# Patient Record
Sex: Female | Born: 1970
Health system: Southern US, Community
[De-identification: ages and names within clinical notes are randomized; demographics above are authoritative.]

## PROBLEM LIST (undated history)

## (undated) DIAGNOSIS — F329 Major depressive disorder, single episode, unspecified: Secondary | ICD-10-CM

## (undated) DIAGNOSIS — F32A Depression, unspecified: Secondary | ICD-10-CM

## (undated) DIAGNOSIS — J449 Chronic obstructive pulmonary disease, unspecified: Secondary | ICD-10-CM

## (undated) DIAGNOSIS — I10 Essential (primary) hypertension: Secondary | ICD-10-CM

## (undated) DIAGNOSIS — F419 Anxiety disorder, unspecified: Secondary | ICD-10-CM

## (undated) HISTORY — PX: TUBAL LIGATION: SHX77

---

## 2002-10-01 ENCOUNTER — Encounter: Payer: Self-pay | Admitting: Emergency Medicine

## 2002-10-01 ENCOUNTER — Encounter: Payer: Self-pay | Admitting: Internal Medicine

## 2002-10-01 ENCOUNTER — Inpatient Hospital Stay (HOSPITAL_COMMUNITY): Admission: EM | Admit: 2002-10-01 | Discharge: 2002-10-03 | Payer: Self-pay | Admitting: Internal Medicine

## 2002-12-22 ENCOUNTER — Emergency Department (HOSPITAL_COMMUNITY): Admission: EM | Admit: 2002-12-22 | Discharge: 2002-12-22 | Payer: Self-pay | Admitting: *Deleted

## 2004-01-14 ENCOUNTER — Emergency Department (HOSPITAL_COMMUNITY): Admission: EM | Admit: 2004-01-14 | Discharge: 2004-01-14 | Payer: Self-pay | Admitting: Emergency Medicine

## 2004-04-22 ENCOUNTER — Emergency Department (HOSPITAL_COMMUNITY): Admission: EM | Admit: 2004-04-22 | Discharge: 2004-04-22 | Payer: Self-pay | Admitting: Emergency Medicine

## 2004-06-02 ENCOUNTER — Emergency Department (HOSPITAL_COMMUNITY): Admission: EM | Admit: 2004-06-02 | Discharge: 2004-06-02 | Payer: Self-pay | Admitting: Emergency Medicine

## 2004-06-06 ENCOUNTER — Emergency Department (HOSPITAL_COMMUNITY): Admission: EM | Admit: 2004-06-06 | Discharge: 2004-06-06 | Payer: Self-pay | Admitting: Emergency Medicine

## 2004-06-08 ENCOUNTER — Emergency Department (HOSPITAL_COMMUNITY): Admission: EM | Admit: 2004-06-08 | Discharge: 2004-06-09 | Payer: Self-pay | Admitting: *Deleted

## 2004-06-13 ENCOUNTER — Emergency Department (HOSPITAL_COMMUNITY): Admission: EM | Admit: 2004-06-13 | Discharge: 2004-06-13 | Payer: Self-pay | Admitting: Emergency Medicine

## 2004-07-03 ENCOUNTER — Emergency Department (HOSPITAL_COMMUNITY): Admission: EM | Admit: 2004-07-03 | Discharge: 2004-07-03 | Payer: Self-pay | Admitting: Emergency Medicine

## 2004-09-08 ENCOUNTER — Emergency Department (HOSPITAL_COMMUNITY): Admission: EM | Admit: 2004-09-08 | Discharge: 2004-09-08 | Payer: Self-pay | Admitting: Emergency Medicine

## 2005-02-27 ENCOUNTER — Ambulatory Visit (HOSPITAL_COMMUNITY): Admission: RE | Admit: 2005-02-27 | Discharge: 2005-02-27 | Payer: Self-pay | Admitting: Obstetrics and Gynecology

## 2005-02-27 ENCOUNTER — Encounter: Payer: Self-pay | Admitting: Obstetrics and Gynecology

## 2006-06-02 ENCOUNTER — Emergency Department (HOSPITAL_COMMUNITY): Admission: EM | Admit: 2006-06-02 | Discharge: 2006-06-02 | Payer: Self-pay | Admitting: Emergency Medicine

## 2009-02-14 ENCOUNTER — Emergency Department (HOSPITAL_COMMUNITY): Admission: EM | Admit: 2009-02-14 | Discharge: 2009-02-14 | Payer: Self-pay | Admitting: Emergency Medicine

## 2009-06-24 ENCOUNTER — Emergency Department (HOSPITAL_COMMUNITY): Admission: EM | Admit: 2009-06-24 | Discharge: 2009-06-24 | Payer: Self-pay | Admitting: Emergency Medicine

## 2009-10-13 ENCOUNTER — Emergency Department (HOSPITAL_COMMUNITY): Admission: EM | Admit: 2009-10-13 | Discharge: 2009-10-13 | Payer: Self-pay | Admitting: Emergency Medicine

## 2010-11-04 NOTE — Procedures (Signed)
   [name], [name]                           ACCOUNT NO.:  [id number]   MEDICAL RECORD NO.:  [id number]                   PATIENT TYPE:  INP   LOCATION:  IC09                                 FACILITY:  [facility]   PHYSICIAN:  Edward L. Juanetta Gosling, M.D.             DATE OF BIRTH:  11/07/1970   DATE OF PROCEDURE:  10/01/2002  DATE OF DISCHARGE:                                EKG INTERPRETATION   RESULTS:  The rhythm is sinus tachycardia with a rate of about 110.  There  are Q-waves in V1, 2, and 3, which may indicate previous anterior septal  myocardial infarction.  There are ST-T wave changes that are nonspecific and  diffuse.  A normal electrocardiogram.                                               Edward L. Juanetta Gosling, M.D.    ELH/MEDQ  D:  10/01/2002  T:  10/02/2002  Job:  [id number]

## 2010-11-04 NOTE — Procedures (Signed)
   [name], [name]                           ACCOUNT NO.:  [id number]   MEDICAL RECORD NO.:  [id number]                   PATIENT TYPE:  INP   LOCATION:  A308                                 FACILITY:  [facility]   PHYSICIAN:  Edward L. Juanetta Gosling, M.D.             DATE OF BIRTH:  26-Jul-1970   DATE OF PROCEDURE:  10/01/2002  DATE OF DISCHARGE:  10/03/2002                                EKG INTERPRETATION   DATE AND TIME OF TEST:  October 01, 2002 at 0725.   FINDINGS:  The rhythm is sinus rhythm with PVCs.  There is a suggestion of  left atrial enlargement.  Somewhat slow R wave progression across the  precordium may indicate a previous anterior myocardial infarction and  clinical correlation is suggested.   IMPRESSION:  Abnormal electrocardiogram.                                               Edward L. Juanetta Gosling, M.D.    ELH/MEDQ  D:  10/07/2002  T:  10/08/2002  Job:  [id number]
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
## 2010-11-04 NOTE — H&P (Signed)
[name], [name]               ACCOUNT NO.:  [id number]   MEDICAL RECORD NO.:  [id number]          PATIENT TYPE:  AMB   LOCATION:  DAY                           FACILITY:  [facility]   PHYSICIAN:  Tilda Burrow, M.D. DATE OF BIRTH:  Oct 14, 1970   DATE OF ADMISSION:  02/27/2005  DATE OF DISCHARGE:  LH                                HISTORY & PHYSICAL   ADMITTING DIAGNOSES:  Desire for elective permanent sterilization.   HISTORY OF PRESENT ILLNESS:  This 40 year old female referred from  [facility] was referred to our office and is  scheduled for elective permanent sterilization.  She has been seen in our  office, with confirmation of her desire for permanent sterilization.  Laparoscopic tubal sterilization with fallopian rings has been reviewed with  the technical aspects of the procedure discussed in detail.  She had  originally been scheduled for sterilization in May, but due to family  difficulties, had to have the procedure re-scheduled.  Pap smear, GC, and  Chlamydia were all within normal limits earlier this year.   SOCIAL HISTORY:  Significant in that the patient's fiancee is in jail for  DUI and driving without a license.  She is a gravida 4, para 3, with the  youngest child 88 months old.  She has a very difficult family history with  the loss of mother and grandmother and 1 other relative in a brief period of  time.  Nonetheless, she is certain of her desire for permanent sterilization  as she rebuilds her life.   LAST MENSTRUAL PERIOD:  February 17, 2005.  Menses are usually 6 days in  length - 1 day heavy, 1 day light, 3 days heavy, 2 days light.   PHYSICAL EXAMINATION:  HEENT:  Pupils equal, round and reactive to light.  NECK:  Supple.  CHEST:  Clear to auscultation.  ABDOMEN:  Nontender.  PELVIC:  Normal external genitalia with vaginal exam with normal excretions,  on menses.  Cervix multiparous.  Uterus anterior.  Adnexa nontender.   PLAN:   Laparotomy tubal sterilization with fallopian rings on February 27, 2005.      Tilda Burrow, M.D.  Electronically Signed     JVF/MEDQ  D:  02/26/2005  T:  02/27/2005  Job:  [id number]   cc:   [facility]

## 2010-11-04 NOTE — Procedures (Signed)
   NAMEMARICIA, [name]                           ACCOUNT NO.:  [id number]   MEDICAL RECORD NO.:  [id number]                   PATIENT TYPE:  INP   LOCATION:  A308                                 FACILITY:  [facility]   PHYSICIAN:  Edward L. Juanetta Gosling, M.D.             DATE OF BIRTH:  [birth year]   DATE OF PROCEDURE:  10/01/2002  DATE OF DISCHARGE:  10/03/2002                                EKG INTERPRETATION   DATE AND TIME OF TEST:  October 01, 2002 at 0306.   FINDINGS:  The rhythm is sinus rhythm with a rate in the 80s.   IMPRESSION:  Essentially normal electrocardiogram.                                               Edward L. Juanetta Gosling, M.D.    ELH/MEDQ  D:  10/07/2002  T:  10/08/2002  Job:  [id number]

## 2010-11-04 NOTE — Op Note (Signed)
[name], [name]               ACCOUNT NO.:  [id number]   MEDICAL RECORD NO.:  [id number]          PATIENT TYPE:  AMB   LOCATION:  DAY                           FACILITY:  [facility]   PHYSICIAN:  Tilda Burrow, M.D. DATE OF BIRTH:  [birth year]   DATE OF PROCEDURE:  DATE OF DISCHARGE:                                 OPERATIVE REPORT   PREOPERATIVE DIAGNOSES:  1.  Elective sterilization.  2.  Cervical polyp.   POSTOPERATIVE DIAGNOSES:  1.  Elective sterilization.  2.  Cervical polyp.   PROCEDURE:  Laparoscopic tubal sterilization, Falope ring, excision of  cervical polyp.   SURGEON:  Tilda Burrow, M.D.   ASSISTANTAmie Critchley, CST   ANESTHESIA:  General.   COMPLICATIONS:  None.   FINDINGS:  Cervical polyp high in the endocervical canal, taken out in  fragments, sent to lab for histology.  Omental adhesions to the anterior  abdominal wall partially transected.  Some residual adhesions near the  umbilicus left intact.   DETAILS OF PROCEDURE:  The patient was taken to the operating room and  prepped and draped for combined abdominal and vaginal procedure.  We then  used the speculum and inserted it into the vagina, identified the cervix,  grasped the cervix, pulled it down, and could see this endocervical polyp.  This was grasped with Randall stone forceps and Metzenbaum scissors were  used to snip it.  The polyp came out in fragments.  A portion of the  specimen was sent for histology.   In-and-out catheterization was performed and then Hulka tenaculum attached  to the cervix for uterine manipulation.  The infraumbilical vertical 1-cm  skin incision was performed as well as the transverse suprapubic incision.  Veress needle was introduced through the umbilicus with ease.  The  peritoneal cavity was easily accessed and under 12 mmHg, the  pneumoperitoneum was achieved.  Laparoscopic trocar was introduced through  the umbilicus easily with no evidence of trauma to  internal organs.  There  were some omental adhesions to the anterior abdominal wall just to the left  and below the umbilicus.  Suprapubic trocar was inserted under direct  laparoscopic visualization and then attention directed to the adnexa.  The  right adnexa could be identified first and the Falope ring applier used to  grasp the mid portion of the tube, elevate it, and apply the ring without  difficulty.  We then proceeded with placement of the left Falope ring with  similar ease.  There was difficulty due to the patient's obesity and  abdominal wall laxity and we could not anesthetize the left tube.  The right  tube was able to be anesthetized with percutaneous Marcaine solution, both  in the broad ligament and in the incarcerated knuckle of tube.  The  Metzenbaum scissors were used to trim portions of the omental adhesion  before placement of the left fallopian tube ring.  This allowed improved  visibility.  Only the portion of adhesions that interfered with visibility  were addressed.  The rest were left intact without difficulty.   Deflation of the  abdomen was followed by 200 cc of saline instilled in the  abdomen.  Laparoscopic trocars were removed.  Subcuticular 4-0 Dexon closure  of both skin incisions and the patient went to the recovery room in good  condition.      Tilda Burrow, M.D.  Electronically Signed     JVF/MEDQ  D:  02/27/2005  T:  02/27/2005  Job:  [id number]
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
## 2011-07-14 ENCOUNTER — Encounter (HOSPITAL_COMMUNITY): Payer: Self-pay | Admitting: *Deleted

## 2011-07-14 ENCOUNTER — Emergency Department (HOSPITAL_COMMUNITY)
Admission: EM | Admit: 2011-07-14 | Discharge: 2011-07-14 | Disposition: A | Discharge status: Home / Self Care | Payer: Self-pay | Attending: Emergency Medicine | Admitting: Emergency Medicine

## 2011-07-14 DIAGNOSIS — X12XXXA Contact with other hot fluids, initial encounter: Secondary | ICD-10-CM | POA: Insufficient documentation

## 2011-07-14 DIAGNOSIS — T2122XA Burn of second degree of abdominal wall, initial encounter: Secondary | ICD-10-CM | POA: Insufficient documentation

## 2011-07-14 DIAGNOSIS — R109 Unspecified abdominal pain: Secondary | ICD-10-CM | POA: Insufficient documentation

## 2011-07-14 DIAGNOSIS — F17200 Nicotine dependence, unspecified, uncomplicated: Secondary | ICD-10-CM | POA: Insufficient documentation

## 2011-07-14 LAB — DIFFERENTIAL
Basophils Absolute: 0 10*3/uL (ref 0.0–0.1)
Eosinophils Absolute: 0.3 10*3/uL (ref 0.0–0.7)
Eosinophils Relative: 3 % (ref 0–5)
Lymphocytes Relative: 24 % (ref 12–46)
Lymphs Abs: 2.3 10*3/uL (ref 0.7–4.0)
Monocytes Absolute: 0.7 10*3/uL (ref 0.1–1.0)
Monocytes Relative: 7 % (ref 3–12)
Neutrophils Relative %: 67 % (ref 43–77)

## 2011-07-14 LAB — CBC
HCT: 35.9 % — ABNORMAL LOW (ref 36.0–46.0)
MCH: 30.5 pg (ref 26.0–34.0)
MCHC: 32.6 g/dL (ref 30.0–36.0)
MCV: 93.7 fL (ref 78.0–100.0)
Platelets: 255 10*3/uL (ref 150–400)
RDW: 13.5 % (ref 11.5–15.5)

## 2011-07-14 MED ORDER — SILVER SULFADIAZINE 1 % EX CREA
TOPICAL_CREAM | Freq: Once | CUTANEOUS | Status: AC
  Administered 2011-07-14: 22:00:00 via TOPICAL
  Filled 2011-07-14: qty 85

## 2011-07-14 MED ORDER — OXYCODONE-ACETAMINOPHEN 5-325 MG PO TABS
2.0000 | ORAL_TABLET | Freq: Once | ORAL | Status: AC
  Administered 2011-07-14: 2 via ORAL
  Filled 2011-07-14 (×2): qty 2

## 2011-07-14 MED ORDER — OXYCODONE-ACETAMINOPHEN 5-325 MG PO TABS: 1.0000 | ORAL_TABLET | Freq: Four times a day (QID) | ORAL | Status: AC | PRN
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
## 2011-07-17 LAB — POCT I-STAT, CHEM 8
BUN: 15 mg/dL (ref 6–23)
Calcium, Ion: 1.23 mmol/L (ref 1.12–1.32)
Chloride: 109 mEq/L (ref 96–112)
Creatinine, Ser: 0.6 mg/dL (ref 0.50–1.10)
Glucose, Bld: 136 mg/dL — ABNORMAL HIGH (ref 70–99)
HCT: 66 % — ABNORMAL HIGH (ref 36.0–46.0)
Hemoglobin: 22.4 g/dL (ref 12.0–15.0)
Potassium: 3.4 mEq/L — ABNORMAL LOW (ref 3.5–5.1)
Sodium: 141 mEq/L (ref 135–145)
TCO2: 25 mmol/L (ref 0–100)

## 2012-02-12 ENCOUNTER — Emergency Department (HOSPITAL_COMMUNITY)
Admission: EM | Admit: 2012-02-12 | Discharge: 2012-02-12 | Disposition: A | Discharge status: Home / Self Care | Payer: Self-pay | Attending: Emergency Medicine | Admitting: Emergency Medicine

## 2012-02-12 ENCOUNTER — Encounter (HOSPITAL_COMMUNITY): Payer: Self-pay | Admitting: *Deleted

## 2012-02-12 DIAGNOSIS — F17200 Nicotine dependence, unspecified, uncomplicated: Secondary | ICD-10-CM | POA: Insufficient documentation

## 2012-02-12 DIAGNOSIS — L02419 Cutaneous abscess of limb, unspecified: Secondary | ICD-10-CM

## 2012-02-12 DIAGNOSIS — IMO0002 Reserved for concepts with insufficient information to code with codable children: Secondary | ICD-10-CM | POA: Insufficient documentation

## 2012-02-12 MED ORDER — DOXYCYCLINE HYCLATE 100 MG PO TABS
100.0000 mg | ORAL_TABLET | Freq: Once | ORAL | Status: AC
  Administered 2012-02-12: 100 mg via ORAL
  Filled 2012-02-12: qty 1

## 2012-02-12 MED ORDER — HYDROCODONE-ACETAMINOPHEN 5-325 MG PO TABS
1.0000 | ORAL_TABLET | Freq: Once | ORAL | Status: AC
  Administered 2012-02-12: 1 via ORAL
  Filled 2012-02-12: qty 1

## 2012-02-12 MED ORDER — HYDROCODONE-ACETAMINOPHEN 5-325 MG PO TABS: 1.0000 | ORAL_TABLET | Freq: Four times a day (QID) | ORAL | Status: AC | PRN

## 2012-02-12 MED ORDER — DOXYCYCLINE HYCLATE 100 MG PO CAPS: 100.0000 mg | ORAL_CAPSULE | Freq: Two times a day (BID) | ORAL | Status: AC

## 2012-02-12 NOTE — ED Notes (Signed)
Alert, NAD, abscess rt axilla, Has had similar episodes in past.

## 2012-02-12 NOTE — ED Notes (Signed)
Abscess to right axillary x 1.5 weeks per pt, denies any N/V/D, fevers at night low grade per pt.

## 2012-02-12 NOTE — ED Provider Notes (Signed)
History     CSN: [id number]  Arrival date & time 02/12/12  1458   First MD Initiated Contact with Patient 02/12/12 1522      Chief Complaint  Patient presents with  . Abscess    (Consider location/radiation/quality/duration/timing/severity/associated sxs/prior treatment) HPI Comments: Has had  Axillary abscesses in past told by dr. Jorene Guest, her PCP to stop shaving.  Patient is a 41 y.o. female presenting with abscess. The history is provided by the patient. No language interpreter was used.  Abscess  This is a new problem. Episode onset: 1.5 weeks ago. The problem has been gradually worsening. Affected Location: R axilla. The problem is moderate. The abscess is characterized by redness and painfulness. The abscess first occurred at home. Pertinent negatives include no fever. Her past medical history does not include atopy in family or skin abscesses in family. There were no sick contacts. She has received no recent medical care.    No past medical history on file.  Past Surgical History  Procedure Date  . Cesarean section     No family history on file.  History  Substance Use Topics  . Smoking status: Current Everyday Smoker -- 0.5 packs/day    Types: Cigarettes  . Smokeless tobacco: Not on file  . Alcohol Use: No    OB History    Grav Para Term Preterm Abortions TAB SAB Ect Mult Living                  Review of Systems  Constitutional: Negative for fever.  Skin:       Abscess   All other systems reviewed and are negative.    Allergies  Coconut oil; Latex; Peach; and Tramadol  Home Medications   Current Outpatient Rx  Name Route Sig Dispense Refill  . ACETAMINOPHEN 500 MG PO TABS Oral Take 1,000 mg by mouth 2 (two) times daily as needed. For fever and/or pain      BP 148/97  Pulse 89  Temp 98.3 F (36.8 C) (Oral)  Resp 20  Ht 5\' 3"  (1.6 m)  Wt 227 lb (102.967 kg)  BMI 40.21 kg/m2  SpO2 99%  LMP 01/29/2012  Physical Exam  Nursing note and  vitals reviewed. Constitutional: She is oriented to person, place, and time. She appears well-developed and well-nourished. No distress.  HENT:  Head: Normocephalic and atraumatic.  Eyes: EOM are normal.  Neck: Normal range of motion.  Cardiovascular: Normal rate, regular rhythm and normal heart sounds.   Pulmonary/Chest: Effort normal and breath sounds normal.  Abdominal: Soft. She exhibits no distension. There is no tenderness.  Musculoskeletal: Normal range of motion.  Neurological: She is alert and oriented to person, place, and time.  Skin: Skin is warm and dry.       Area of redness and induration ~ 1 x 2 cm in R axilla.  + PT.    No fluctuance.   Psychiatric: She has a normal mood and affect. Judgment normal.    ED Course  Procedures (including critical care time)  Labs Reviewed - No data to display No results found.   1. Axillary abscess       MDM  rx-doxycycline , 20 Hydrocodone, 20 Warm compresses. Return prn         Evalina Field, [place] 02/12/12 1656

## 2012-02-13 NOTE — ED Provider Notes (Signed)
Medical screening examination/treatment/procedure(s) were performed by non-physician practitioner and as supervising physician I was immediately available for consultation/collaboration.   Carleene Cooper III, MD 02/13/12 1154

## 2012-03-22 ENCOUNTER — Encounter (HOSPITAL_COMMUNITY): Payer: Self-pay | Admitting: Emergency Medicine

## 2012-03-22 ENCOUNTER — Emergency Department (HOSPITAL_COMMUNITY)
Admission: EM | Admit: 2012-03-22 | Discharge: 2012-03-22 | Disposition: A | Discharge status: Home / Self Care | Payer: Self-pay | Attending: Emergency Medicine | Admitting: Emergency Medicine

## 2012-03-22 DIAGNOSIS — F3289 Other specified depressive episodes: Secondary | ICD-10-CM | POA: Insufficient documentation

## 2012-03-22 DIAGNOSIS — S0180XA Unspecified open wound of other part of head, initial encounter: Secondary | ICD-10-CM | POA: Insufficient documentation

## 2012-03-22 DIAGNOSIS — Z79899 Other long term (current) drug therapy: Secondary | ICD-10-CM | POA: Insufficient documentation

## 2012-03-22 DIAGNOSIS — F411 Generalized anxiety disorder: Secondary | ICD-10-CM | POA: Insufficient documentation

## 2012-03-22 DIAGNOSIS — Y9210 Unspecified residential institution as the place of occurrence of the external cause: Secondary | ICD-10-CM | POA: Insufficient documentation

## 2012-03-22 DIAGNOSIS — S01459A Open bite of unspecified cheek and temporomandibular area, initial encounter: Secondary | ICD-10-CM

## 2012-03-22 DIAGNOSIS — W540XXA Bitten by dog, initial encounter: Secondary | ICD-10-CM | POA: Insufficient documentation

## 2012-03-22 DIAGNOSIS — F17200 Nicotine dependence, unspecified, uncomplicated: Secondary | ICD-10-CM | POA: Insufficient documentation

## 2012-03-22 DIAGNOSIS — F329 Major depressive disorder, single episode, unspecified: Secondary | ICD-10-CM | POA: Insufficient documentation

## 2012-03-22 HISTORY — DX: Anxiety disorder, unspecified: F41.9

## 2012-03-22 HISTORY — DX: Major depressive disorder, single episode, unspecified: F32.9

## 2012-03-22 HISTORY — DX: Depression, unspecified: F32.A

## 2012-03-22 MED ORDER — AMOXICILLIN-POT CLAVULANATE 875-125 MG PO TABS: 1.0000 | ORAL_TABLET | Freq: Two times a day (BID) | ORAL | Status: DC

## 2012-03-22 MED ORDER — HYDROCODONE-ACETAMINOPHEN 5-325 MG PO TABS
2.0000 | ORAL_TABLET | Freq: Once | ORAL | Status: AC
  Administered 2012-03-22: 2 via ORAL
  Filled 2012-03-22: qty 2

## 2012-03-22 MED ORDER — ONDANSETRON HCL 4 MG PO TABS
4.0000 mg | ORAL_TABLET | Freq: Once | ORAL | Status: AC
  Administered 2012-03-22: 4 mg via ORAL
  Filled 2012-03-22: qty 1

## 2012-03-22 MED ORDER — HYDROCODONE-ACETAMINOPHEN 5-325 MG PO TABS: 1.0000 | ORAL_TABLET | ORAL | Status: DC | PRN

## 2012-03-22 MED ORDER — AMOXICILLIN-POT CLAVULANATE 875-125 MG PO TABS
1.0000 | ORAL_TABLET | Freq: Once | ORAL | Status: AC
  Administered 2012-03-22: 1 via ORAL
  Filled 2012-03-22: qty 1

## 2012-03-22 MED ORDER — TETANUS-DIPHTH-ACELL PERTUSSIS 5-2.5-18.5 LF-MCG/0.5 IM SUSP
0.5000 mL | Freq: Once | INTRAMUSCULAR | Status: DC
  Filled 2012-03-22: qty 0.5
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
## 2012-03-22 NOTE — ED Notes (Signed)
H. Bryant, PA at bedside. 
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
## 2012-03-28 NOTE — ED Provider Notes (Signed)
Medical screening examination/treatment/procedure(s) were performed by non-physician practitioner and as supervising physician I was immediately available for consultation/collaboration.   For 03/22/12 Note by Mr. Tawanna Solo, MD 04/02/12 [phone]

## 2012-04-02 NOTE — ED Provider Notes (Signed)
Medical screening examination/treatment/procedure(s) were performed by non-physician practitioner and as supervising physician I was immediately available for consultation/collaboration.  Flint Melter, MD 04/02/12 0100

## 2012-05-04 ENCOUNTER — Emergency Department (HOSPITAL_COMMUNITY)
Admission: EM | Admit: 2012-05-04 | Discharge: 2012-05-04 | Disposition: A | Discharge status: Home / Self Care | Payer: Self-pay | Attending: Emergency Medicine | Admitting: Emergency Medicine

## 2012-05-04 ENCOUNTER — Encounter (HOSPITAL_COMMUNITY): Payer: Self-pay

## 2012-05-04 DIAGNOSIS — Z8659 Personal history of other mental and behavioral disorders: Secondary | ICD-10-CM | POA: Insufficient documentation

## 2012-05-04 DIAGNOSIS — K0889 Other specified disorders of teeth and supporting structures: Secondary | ICD-10-CM

## 2012-05-04 DIAGNOSIS — F17200 Nicotine dependence, unspecified, uncomplicated: Secondary | ICD-10-CM | POA: Insufficient documentation

## 2012-05-04 DIAGNOSIS — K089 Disorder of teeth and supporting structures, unspecified: Secondary | ICD-10-CM | POA: Insufficient documentation

## 2012-05-04 MED ORDER — HYDROCODONE-ACETAMINOPHEN 5-325 MG PO TABS: ORAL_TABLET | ORAL | Status: DC

## 2012-05-04 MED ORDER — PENICILLIN V POTASSIUM 500 MG PO TABS: 500.0000 mg | ORAL_TABLET | Freq: Four times a day (QID) | ORAL | Status: AC

## 2012-05-04 MED ORDER — HYDROCODONE-ACETAMINOPHEN 5-325 MG PO TABS
1.0000 | ORAL_TABLET | Freq: Once | ORAL | Status: AC
  Administered 2012-05-04: 1 via ORAL
  Filled 2012-05-04: qty 1

## 2012-05-04 MED ORDER — PENICILLIN V POTASSIUM 250 MG PO TABS
500.0000 mg | ORAL_TABLET | Freq: Once | ORAL | Status: AC
  Administered 2012-05-04: 500 mg via ORAL
  Filled 2012-05-04: qty 2

## 2012-05-04 NOTE — ED Provider Notes (Signed)
History     CSN: [id number]  Arrival date & time 05/04/12  1339   First MD Initiated Contact with Patient 05/04/12 1358      Chief Complaint  Patient presents with  . Dental Pain    (Consider location/radiation/quality/duration/timing/severity/associated sxs/prior treatment) Patient is a 41 y.o. female presenting with tooth pain. The history is provided by the patient.  Dental PainThe primary symptoms include mouth pain. Primary symptoms do not include dental injury, oral bleeding, oral lesions, headaches, fever, shortness of breath, sore throat, angioedema or cough. The symptoms began 3 to 5 days ago. The symptoms are worsening. The symptoms are new. The symptoms occur constantly.  Affected locations include: teeth.  Additional symptoms include: dental sensitivity to temperature and gum tenderness. Additional symptoms do not include: gum swelling, purulent gums, trismus, jaw pain, facial swelling, trouble swallowing, pain with swallowing, drooling, ear pain and swollen glands. Medical issues include: smoking and periodontal disease.    Past Medical History  Diagnosis Date  . Anxiety   . Depression     Past Surgical History  Procedure Date  . Cesarean section     No family history on file.  History  Substance Use Topics  . Smoking status: Current Every Day Smoker -- 0.5 packs/day    Types: Cigarettes  . Smokeless tobacco: Not on file  . Alcohol Use: No    OB History    Grav Para Term Preterm Abortions TAB SAB Ect Mult Living                  Review of Systems  Constitutional: Negative for fever and appetite change.  HENT: Positive for dental problem. Negative for ear pain, congestion, sore throat, facial swelling, drooling, trouble swallowing, neck pain and neck stiffness.   Eyes: Negative for pain and visual disturbance.  Respiratory: Negative for cough and shortness of breath.   Neurological: Negative for dizziness, facial asymmetry and headaches.    Hematological: Negative for adenopathy.  All other systems reviewed and are negative.    Allergies  Coconut oil; Latex; Peach; and Tramadol  Home Medications   Current Outpatient Rx  Name  Route  Sig  Dispense  Refill  . ACETAMINOPHEN 500 MG PO TABS   Oral   Take 500 mg by mouth every 6 (six) hours as needed. Pain           BP 148/78  Pulse 88  Temp 98.5 F (36.9 C) (Oral)  Resp 20  Ht 5' 3.5" (1.613 m)  Wt 240 lb (108.863 kg)  BMI 41.85 kg/m2  SpO2 100%  LMP 04/19/2012  Physical Exam  Nursing note and vitals reviewed. Constitutional: She is oriented to person, place, and time. She appears well-developed and well-nourished. No distress.  HENT:  Head: Normocephalic and atraumatic. No trismus in the jaw.  Right Ear: Tympanic membrane and ear canal normal.  Left Ear: Tympanic membrane and ear canal normal.  Mouth/Throat: Uvula is midline, oropharynx is clear and moist and mucous membranes are normal. Dental caries present. No dental abscesses or uvula swelling.         ttp of the right second molar.  No facial edema, trismus or obvious dental abscess  Eyes: EOM are normal. Pupils are equal, round, and reactive to light.  Neck: Normal range of motion. Neck supple.  Cardiovascular: Normal rate, regular rhythm, normal heart sounds and intact distal pulses.   No murmur heard. Pulmonary/Chest: Effort normal and breath sounds normal.  Musculoskeletal: Normal range of motion.  Lymphadenopathy:    She has no cervical adenopathy.  Neurological: She is alert and oriented to person, place, and time. She exhibits normal muscle tone. Coordination normal.  Skin: Skin is warm and dry.    ED Course  Procedures (including critical care time)  Labs Reviewed - No data to display No results found.      MDM    Dental decay of the right lower second molar.  Pt advised that she willneed further management of dental problem with a dentist.  Referral list  given  Prescribed: Pen VK norco #20        Glendal Cassaday L. Iantha, [place] 05/05/12 2111

## 2012-05-04 NOTE — ED Notes (Signed)
Pt reports toothache to right lower

## 2012-05-06 NOTE — ED Provider Notes (Signed)
Medical screening examination/treatment/procedure(s) were performed by non-physician practitioner and as supervising physician I was immediately available for consultation/collaboration.   Shelda Jakes, MD 05/06/12 0030

## 2012-06-16 ENCOUNTER — Emergency Department (HOSPITAL_COMMUNITY)
Admission: EM | Admit: 2012-06-16 | Discharge: 2012-06-16 | Disposition: A | Discharge status: Home / Self Care | Payer: Self-pay | Attending: Emergency Medicine | Admitting: Emergency Medicine

## 2012-06-16 ENCOUNTER — Emergency Department (HOSPITAL_COMMUNITY): Payer: Self-pay

## 2012-06-16 ENCOUNTER — Encounter (HOSPITAL_COMMUNITY): Payer: Self-pay

## 2012-06-16 DIAGNOSIS — M25552 Pain in left hip: Secondary | ICD-10-CM

## 2012-06-16 DIAGNOSIS — F17200 Nicotine dependence, unspecified, uncomplicated: Secondary | ICD-10-CM | POA: Insufficient documentation

## 2012-06-16 DIAGNOSIS — Z8659 Personal history of other mental and behavioral disorders: Secondary | ICD-10-CM | POA: Insufficient documentation

## 2012-06-16 DIAGNOSIS — F411 Generalized anxiety disorder: Secondary | ICD-10-CM | POA: Insufficient documentation

## 2012-06-16 DIAGNOSIS — Z8739 Personal history of other diseases of the musculoskeletal system and connective tissue: Secondary | ICD-10-CM | POA: Insufficient documentation

## 2012-06-16 DIAGNOSIS — K089 Disorder of teeth and supporting structures, unspecified: Secondary | ICD-10-CM | POA: Insufficient documentation

## 2012-06-16 DIAGNOSIS — M25559 Pain in unspecified hip: Secondary | ICD-10-CM | POA: Insufficient documentation

## 2012-06-16 MED ORDER — DEXAMETHASONE 4 MG PO TABS: ORAL_TABLET | ORAL | Status: DC

## 2012-06-16 MED ORDER — HYDROCODONE-ACETAMINOPHEN 5-325 MG PO TABS: ORAL_TABLET | ORAL | Status: DC

## 2012-06-16 MED ORDER — DEXAMETHASONE SODIUM PHOSPHATE 4 MG/ML IJ SOLN
8.0000 mg | Freq: Once | INTRAMUSCULAR | Status: AC
  Administered 2012-06-16: 8 mg via INTRAMUSCULAR
  Filled 2012-06-16: qty 2

## 2012-06-16 MED ORDER — PROMETHAZINE HCL 12.5 MG PO TABS
12.5000 mg | ORAL_TABLET | Freq: Once | ORAL | Status: AC
  Administered 2012-06-16: 12.5 mg via ORAL
  Filled 2012-06-16: qty 1

## 2012-06-16 MED ORDER — MORPHINE SULFATE 4 MG/ML IJ SOLN
8.0000 mg | Freq: Once | INTRAMUSCULAR | Status: AC
  Administered 2012-06-16: 8 mg via INTRAMUSCULAR
  Filled 2012-06-16: qty 2

## 2012-06-16 NOTE — ED Notes (Signed)
Pt presents with left hip pain that awoke her this morning. Pt denies injury, However does state she has a new job and has been working 12 hr shifts standing in one spot. Pain has increased  Throughout the day.  NAD noted at this time.  Pt refusing crutches at this time secondary to has a walker at home.

## 2012-06-16 NOTE — ED Notes (Signed)
Pt reports left hip pain, pain woke her from sleep.  Denies any known injury.

## 2012-06-16 NOTE — ED Provider Notes (Signed)
History     CSN: [id number]  Arrival date & time 06/16/12  1547   First MD Initiated Contact with Patient 06/16/12 1707      Chief Complaint  Patient presents with  . Hip Pain    (Consider location/radiation/quality/duration/timing/severity/associated sxs/prior treatment) HPI Comments: Pt has hx of left knee problems. She has been working extra during [employer] doing a lot of standing. Today she was awaken with severe pain of the left hip. Denies fever, chills, or previous operations.  Patient is a 41 y.o. female presenting with hip pain. The history is provided by the patient.  Hip Pain This is a chronic problem. The current episode started today. The problem occurs constantly. The problem has been gradually worsening. Associated symptoms include arthralgias. Pertinent negatives include no abdominal pain, chest pain, coughing, fever or neck pain. The symptoms are aggravated by bending, standing and walking. She has tried acetaminophen for the symptoms. The treatment provided no relief.    Past Medical History  Diagnosis Date  . Anxiety   . Depression     Past Surgical History  Procedure Date  . Cesarean section     No family history on file.  History  Substance Use Topics  . Smoking status: Current Every Day Smoker -- 0.5 packs/day    Types: Cigarettes  . Smokeless tobacco: Not on file  . Alcohol Use: No    OB History    Grav Para Term Preterm Abortions TAB SAB Ect Mult Living                  Review of Systems  Constitutional: Negative for fever and activity change.       All ROS Neg except as noted in HPI  HENT: Positive for dental problem. Negative for nosebleeds and neck pain.   Eyes: Negative for photophobia and discharge.  Respiratory: Negative for cough, shortness of breath and wheezing.   Cardiovascular: Negative for chest pain and palpitations.  Gastrointestinal: Negative for abdominal pain and blood in stool.  Genitourinary: Negative  for dysuria, frequency and hematuria.  Musculoskeletal: Positive for arthralgias. Negative for back pain.  Skin: Negative.   Neurological: Negative for dizziness, seizures and speech difficulty.  Psychiatric/Behavioral: Negative for hallucinations and confusion. The patient is nervous/anxious.     Allergies  Coconut oil; Latex; Peach; and Tramadol  Home Medications   Current Outpatient Rx  Name  Route  Sig  Dispense  Refill  . ACETAMINOPHEN 500 MG PO TABS   Oral   Take 500 mg by mouth every 6 (six) hours as needed. Pain         . HYDROCODONE-ACETAMINOPHEN 5-325 MG PO TABS      Take one-two tabs po q 4-6 hrs prn pain   20 tablet   0     BP 154/84  Pulse 94  Temp 98.1 F (36.7 C) (Oral)  Resp 20  Ht 5' 3.5" (1.613 m)  Wt 226 lb (102.513 kg)  BMI 39.41 kg/m2  SpO2 100%  LMP 06/13/2012  Physical Exam  Nursing note and vitals reviewed. Constitutional: She is oriented to person, place, and time. She appears well-developed and well-nourished.  Non-toxic appearance.  HENT:  Head: Normocephalic.  Right Ear: Tympanic membrane and external ear normal.  Left Ear: Tympanic membrane and external ear normal.  Eyes: EOM and lids are normal. Pupils are equal, round, and reactive to light.  Neck: Normal range of motion. Neck supple. Carotid bruit is not present.  Cardiovascular: Normal  rate, regular rhythm, normal heart sounds, intact distal pulses and normal pulses.   Pulmonary/Chest: Breath sounds normal. No respiratory distress.       Few scattered rhonchi.  Abdominal: Soft. Bowel sounds are normal. There is no tenderness. There is no guarding.  Musculoskeletal: Normal range of motion.       Pain with attempted ROM and palpation of the left hip.  No hot areas. No deformity. Distal pulses wnl. Neg Homan's sign. Soreness with attempted ROM of the left knee.  Lymphadenopathy:       Head (right side): No submandibular adenopathy present.       Head (left side): No submandibular  adenopathy present.    She has no cervical adenopathy.  Neurological: She is alert and oriented to person, place, and time. She has normal strength. No cranial nerve deficit or sensory deficit.  Skin: Skin is warm and dry.  Psychiatric: She has a normal mood and affect. Her speech is normal.    ED Course  Procedures (including critical care time)  Labs Reviewed - No data to display Dg Hip Complete Left  06/16/2012  *RADIOLOGY REPORT*  Clinical Data: Left hip pain.  LEFT HIP - COMPLETE 2+ VIEW  Comparison: None.  Findings: Hip joint spaces maintained.  No degenerative changes. No fracture.  IMPRESSION: Negative.   Original Report Authenticated By: Leanna Battles, M.D.      No diagnosis found.    MDM  I have reviewed nursing notes, vital signs, and all appropriate lab and imaging results for this patient. The xray is negative for fracture or dislocation. Vital signs are wnl. No reported hx of trauma. No increase redness, medial thigh pain, or edema. No previous hx of DVT. Suspect pt is having an inflammatory attack following increase standing and walking. Pt treated with dexamethasone, and norco. Pt to see Dr Romeo Apple for additional evaluation.       Kathie Dike, [place] 06/16/12 1737

## 2012-06-17 NOTE — ED Provider Notes (Signed)
Medical screening examination/treatment/procedure(s) were performed by non-physician practitioner and as supervising physician I was immediately available for consultation/collaboration.  Kiley Torrence, MD 06/17/12 2055 

## 2012-06-30 ENCOUNTER — Encounter (HOSPITAL_COMMUNITY): Payer: Self-pay | Admitting: *Deleted

## 2012-06-30 ENCOUNTER — Emergency Department (HOSPITAL_COMMUNITY)
Admission: EM | Admit: 2012-06-30 | Discharge: 2012-06-30 | Disposition: A | Discharge status: Home / Self Care | Payer: No Typology Code available for payment source | Attending: Emergency Medicine | Admitting: Emergency Medicine

## 2012-06-30 ENCOUNTER — Emergency Department (HOSPITAL_COMMUNITY): Payer: No Typology Code available for payment source

## 2012-06-30 DIAGNOSIS — Y92410 Unspecified street and highway as the place of occurrence of the external cause: Secondary | ICD-10-CM | POA: Insufficient documentation

## 2012-06-30 DIAGNOSIS — S335XXA Sprain of ligaments of lumbar spine, initial encounter: Secondary | ICD-10-CM | POA: Insufficient documentation

## 2012-06-30 DIAGNOSIS — Y9389 Activity, other specified: Secondary | ICD-10-CM | POA: Insufficient documentation

## 2012-06-30 DIAGNOSIS — Z8659 Personal history of other mental and behavioral disorders: Secondary | ICD-10-CM | POA: Insufficient documentation

## 2012-06-30 DIAGNOSIS — S39012A Strain of muscle, fascia and tendon of lower back, initial encounter: Secondary | ICD-10-CM

## 2012-06-30 DIAGNOSIS — F17200 Nicotine dependence, unspecified, uncomplicated: Secondary | ICD-10-CM | POA: Insufficient documentation

## 2012-06-30 MED ORDER — OXYCODONE-ACETAMINOPHEN 5-325 MG PO TABS
1.0000 | ORAL_TABLET | Freq: Once | ORAL | Status: AC
  Administered 2012-06-30: 1 via ORAL
  Filled 2012-06-30: qty 1

## 2012-06-30 MED ORDER — IBUPROFEN 600 MG PO TABS: 600.0000 mg | ORAL_TABLET | Freq: Four times a day (QID) | ORAL | Status: AC | PRN

## 2012-06-30 MED ORDER — OXYCODONE-ACETAMINOPHEN 5-325 MG PO TABS: 1.0000 | ORAL_TABLET | ORAL | Status: AC | PRN
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
## 2012-07-01 NOTE — ED Provider Notes (Signed)
Medical screening examination/treatment/procedure(s) were performed by non-physician practitioner and as supervising physician I was immediately available for consultation/collaboration.  Markeshia Giebel, MD 07/01/12 2208 

## 2012-07-21 ENCOUNTER — Emergency Department (HOSPITAL_COMMUNITY)
Admission: EM | Admit: 2012-07-21 | Discharge: 2012-07-21 | Disposition: A | Discharge status: Home / Self Care | Payer: No Typology Code available for payment source | Attending: Emergency Medicine | Admitting: Emergency Medicine

## 2012-07-21 ENCOUNTER — Encounter (HOSPITAL_COMMUNITY): Payer: Self-pay | Admitting: Emergency Medicine

## 2012-07-21 DIAGNOSIS — F17200 Nicotine dependence, unspecified, uncomplicated: Secondary | ICD-10-CM | POA: Insufficient documentation

## 2012-07-21 DIAGNOSIS — Z8659 Personal history of other mental and behavioral disorders: Secondary | ICD-10-CM | POA: Insufficient documentation

## 2012-07-21 DIAGNOSIS — F411 Generalized anxiety disorder: Secondary | ICD-10-CM | POA: Insufficient documentation

## 2012-07-21 DIAGNOSIS — M5430 Sciatica, unspecified side: Secondary | ICD-10-CM

## 2012-07-21 DIAGNOSIS — Z8739 Personal history of other diseases of the musculoskeletal system and connective tissue: Secondary | ICD-10-CM | POA: Insufficient documentation

## 2012-07-21 DIAGNOSIS — Z87828 Personal history of other (healed) physical injury and trauma: Secondary | ICD-10-CM | POA: Insufficient documentation

## 2012-07-21 MED ORDER — HYDROCODONE-ACETAMINOPHEN 7.5-325 MG PO TABS: 1.0000 | ORAL_TABLET | ORAL | Status: AC | PRN

## 2012-07-21 MED ORDER — DEXAMETHASONE 4 MG PO TABS: ORAL_TABLET | ORAL | Status: DC

## 2012-07-21 MED ORDER — BACLOFEN 10 MG PO TABS: 10.0000 mg | ORAL_TABLET | Freq: Three times a day (TID) | ORAL | Status: AC

## 2012-07-21 MED ORDER — DEXAMETHASONE SODIUM PHOSPHATE 4 MG/ML IJ SOLN
8.0000 mg | Freq: Once | INTRAMUSCULAR | Status: AC
  Administered 2012-07-21: 8 mg via INTRAMUSCULAR
  Filled 2012-07-21: qty 2

## 2012-07-21 MED ORDER — MORPHINE SULFATE 4 MG/ML IJ SOLN
8.0000 mg | Freq: Once | INTRAMUSCULAR | Status: AC
  Administered 2012-07-21: 8 mg via INTRAMUSCULAR
  Filled 2012-07-21: qty 2

## 2012-07-21 MED ORDER — DIAZEPAM 5 MG PO TABS
5.0000 mg | ORAL_TABLET | Freq: Once | ORAL | Status: AC
  Administered 2012-07-21: 5 mg via ORAL
  Filled 2012-07-21: qty 1

## 2012-07-21 MED ORDER — ONDANSETRON HCL 4 MG PO TABS
4.0000 mg | ORAL_TABLET | Freq: Once | ORAL | Status: AC
  Administered 2012-07-21: 4 mg via ORAL
  Filled 2012-07-21: qty 1
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
## 2012-07-21 NOTE — ED Notes (Signed)
H. Bryant, PA at bedside. 
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
## 2012-07-22 NOTE — ED Provider Notes (Signed)
Medical screening examination/treatment/procedure(s) were performed by non-physician practitioner and as supervising physician I was immediately available for consultation/collaboration. Devoria Albe, MD, FACEP   Ward Givens, MD 07/22/12 [phone]

## 2012-10-20 ENCOUNTER — Emergency Department (HOSPITAL_COMMUNITY)
Admission: EM | Admit: 2012-10-20 | Discharge: 2012-10-21 | Disposition: A | Discharge status: Home / Self Care | Payer: Self-pay | Attending: Emergency Medicine | Admitting: Emergency Medicine

## 2012-10-20 ENCOUNTER — Encounter (HOSPITAL_COMMUNITY): Payer: Self-pay | Admitting: Emergency Medicine

## 2012-10-20 DIAGNOSIS — M130 Polyarthritis, unspecified: Secondary | ICD-10-CM

## 2012-10-20 DIAGNOSIS — B349 Viral infection, unspecified: Secondary | ICD-10-CM

## 2012-10-20 DIAGNOSIS — B9789 Other viral agents as the cause of diseases classified elsewhere: Secondary | ICD-10-CM | POA: Insufficient documentation

## 2012-10-20 DIAGNOSIS — F17200 Nicotine dependence, unspecified, uncomplicated: Secondary | ICD-10-CM | POA: Insufficient documentation

## 2012-10-20 DIAGNOSIS — Z8659 Personal history of other mental and behavioral disorders: Secondary | ICD-10-CM | POA: Insufficient documentation

## 2012-10-20 DIAGNOSIS — M2540 Effusion, unspecified joint: Secondary | ICD-10-CM | POA: Insufficient documentation

## 2012-10-20 DIAGNOSIS — M129 Arthropathy, unspecified: Secondary | ICD-10-CM | POA: Insufficient documentation

## 2012-10-20 LAB — URINALYSIS, ROUTINE W REFLEX MICROSCOPIC
Bilirubin Urine: NEGATIVE
Ketones, ur: NEGATIVE mg/dL
Leukocytes, UA: NEGATIVE
Nitrite: NEGATIVE
Protein, ur: NEGATIVE mg/dL
Urobilinogen, UA: 1 mg/dL (ref 0.0–1.0)

## 2012-10-20 LAB — CBC WITH DIFFERENTIAL/PLATELET
Hemoglobin: 12.2 g/dL (ref 12.0–15.0)
Lymphocytes Relative: 18 % (ref 12–46)
Lymphs Abs: 1.5 10*3/uL (ref 0.7–4.0)
MCH: 30.1 pg (ref 26.0–34.0)
MCV: 90.1 fL (ref 78.0–100.0)
Monocytes Relative: 5 % (ref 3–12)
Neutrophils Relative %: 75 % (ref 43–77)
Platelets: 294 10*3/uL (ref 150–400)
RBC: 4.05 MIL/uL (ref 3.87–5.11)
WBC: 8.4 10*3/uL (ref 4.0–10.5)

## 2012-10-20 LAB — URINE MICROSCOPIC-ADD ON

## 2012-10-20 LAB — COMPREHENSIVE METABOLIC PANEL
ALT: 33 U/L (ref 0–35)
Alkaline Phosphatase: 102 U/L (ref 39–117)
BUN: 7 mg/dL (ref 6–23)
CO2: 27 mEq/L (ref 19–32)
GFR calc Af Amer: >90 mL/min (ref >90)
GFR calc non Af Amer: >90 mL/min (ref >90)
Glucose, Bld: 116 mg/dL — ABNORMAL HIGH (ref 70–99)
Potassium: 4.2 mEq/L (ref 3.5–5.1)
Sodium: 134 mEq/L — ABNORMAL LOW (ref 135–145)
Total Bilirubin: 0.9 mg/dL (ref 0.3–1.2)

## 2012-10-20 MED ORDER — IBUPROFEN 800 MG PO TABS
800.0000 mg | ORAL_TABLET | Freq: Once | ORAL | Status: AC
  Administered 2012-10-20: 800 mg via ORAL
  Filled 2012-10-20: qty 1

## 2012-10-20 MED ORDER — SODIUM CHLORIDE 0.9 % IV BOLUS (SEPSIS)
1000.0000 mL | Freq: Once | INTRAVENOUS | Status: AC
  Administered 2012-10-20: 1000 mL via INTRAVENOUS

## 2012-10-20 NOTE — ED Notes (Addendum)
Patient reports dizziness and feeling as if she is going to pass out. Also complaining of swelling to right wrist, right hand, left wrist, and left foot.

## 2012-10-20 NOTE — ED Provider Notes (Signed)
History  This chart was scribed for [employer]. Colon Branch, MD by Bennett Scrape, ED Scribe. This patient was seen in room APA02/APA02 and the patient's care was started at 2256.  CSN: [id number]  Arrival date & time 10/20/12  2129   First MD Initiated Contact with Patient 10/20/12 2256      Chief Complaint  Patient presents with  . Dizziness  . Joint Swelling     The history is provided by the patient. No language interpreter was used.    HPI Comments: [name] is a 42 y.o. female who presents to the Emergency Department complaining of 3 days of gradual onset, gradually worsening, constant right hand pain with a "knot" with associated left ankle swelling,left hand swelling and dizziness that started while she was at work. She states that the right hand swelling started first, then her left foot ad now her left hand. The hand pain is worse with movement of the fingers and the foot pain is worse with ambulation. She denies any recent falls or injuries. She reports trying ice with no improvement. She denies taking OTC medications at home to improve symptoms. She denies any prior episodes of gout or arthritis. She denies cough, congestion, fever, sore throat and rhinorrhea as associated symptoms. Pt is a current everyday smoker but denies alcohol use.  PCP is Dr. Jorene Guest Pt is an Higher education careers adviser  Past Medical History  Diagnosis Date  . Anxiety   . Depression     Past Surgical History  Procedure Laterality Date  . Cesarean section    . Tubal ligation      History reviewed. No pertinent family history.  History  Substance Use Topics  . Smoking status: Current Every Day Smoker -- 0.50 packs/day    Types: Cigarettes  . Smokeless tobacco: Not on file  . Alcohol Use: No    No OB history provided.  Review of Systems  Constitutional: Negative for fever.       10 systems reviewed and are negative for acute change except as noted in the HPI  HENT: Negative for congestion.    Eyes: Negative for discharge and redness.  Respiratory: Negative for cough and shortness of breath.   Cardiovascular: Negative for chest pain.  Gastrointestinal: Negative for vomiting and abdominal pain.  Musculoskeletal: Positive for joint swelling. Negative for back pain.  Skin: Negative for rash.  Neurological: Positive for dizziness. Negative for syncope, numbness and headaches.  Psychiatric/Behavioral:       No behavior change    Allergies  Coconut oil; Latex; Peach; Toradol; and Tramadol  Home Medications   Current Outpatient Rx  Name  Route  Sig  Dispense  Refill  . diphenhydramine-acetaminophen (TYLENOL PM) 25-500 MG TABS   Oral   Take 2 tablets by mouth at bedtime as needed (for sleep/pain).           Triage Vitals: BP 144/80  Pulse 110  Temp(Src) 97.6 F (36.4 C) (Oral)  Resp 14  Ht 5\' 3"  (1.6 m)  Wt 236 lb (107.049 kg)  BMI 41.82 kg/m2  SpO2 99%  LMP 10/14/2012  Physical Exam  Nursing note and vitals reviewed. Constitutional: She is oriented to person, place, and time. She appears well-developed and well-nourished. No distress.  HENT:  Head: Normocephalic and atraumatic.  Mild oropharyngeal erythema   Eyes: EOM are normal.  Neck: Neck supple. No tracheal deviation present.  Cardiovascular: Normal rate and regular rhythm.   Pulmonary/Chest: Effort normal and breath sounds normal.  No respiratory distress.  Musculoskeletal: Normal range of motion.  Mild amount of edema to the left foot compared to the right, erythema over the left lateral malleolus, swelling to dorsum and palmar aspect of the right hand with warmth, no erythema associated with the swelling, pulses are present   Neurological: She is alert and oriented to person, place, and time.  Skin: Skin is warm and dry.  Brisk capillary refill in the right hand and left foot  Psychiatric: She has a normal mood and affect. Her behavior is normal.    ED Course  Procedures (including critical care  time) Results for orders placed during the hospital encounter of 10/20/12  CBC WITH DIFFERENTIAL      Result Value Range   WBC 8.4  4.0 - 10.5 K/uL   RBC 4.05  3.87 - 5.11 MIL/uL   Hemoglobin 12.2  12.0 - 15.0 g/dL   HCT 16.1  09.6 - 04.5 %   MCV 90.1  78.0 - 100.0 fL   MCH 30.1  26.0 - 34.0 pg   MCHC 33.4  30.0 - 36.0 g/dL   RDW 40.9  81.1 - 91.4 %   Platelets 294  150 - 400 K/uL   Neutrophils Relative 75  43 - 77 %   Neutro Abs 6.3  1.7 - 7.7 K/uL   Lymphocytes Relative 18  12 - 46 %   Lymphs Abs 1.5  0.7 - 4.0 K/uL   Monocytes Relative 5  3 - 12 %   Monocytes Absolute 0.4  0.1 - 1.0 K/uL   Eosinophils Relative 3  0 - 5 %   Eosinophils Absolute 0.2  0.0 - 0.7 K/uL   Basophils Relative 0  0 - 1 %   Basophils Absolute 0.0  0.0 - 0.1 K/uL  COMPREHENSIVE METABOLIC PANEL      Result Value Range   Sodium 134 (*) 135 - 145 mEq/L   Potassium 4.2  3.5 - 5.1 mEq/L   Chloride 100  96 - 112 mEq/L   CO2 27  19 - 32 mEq/L   Glucose, Bld 116 (*) 70 - 99 mg/dL   BUN 7  6 - 23 mg/dL   Creatinine, Ser 7.82  0.50 - 1.10 mg/dL   Calcium 9.1  8.4 - 95.6 mg/dL   Total Protein 7.1  6.0 - 8.3 g/dL   Albumin 3.2 (*) 3.5 - 5.2 g/dL   AST 41 (*) 0 - 37 U/L   ALT 33  0 - 35 U/L   Alkaline Phosphatase 102  39 - 117 U/L   Total Bilirubin 0.9  0.3 - 1.2 mg/dL   GFR calc non Af Amer >90  >90 mL/min   GFR calc Af Amer >90  >90 mL/min  URINALYSIS, ROUTINE W REFLEX MICROSCOPIC      Result Value Range   Color, Urine YELLOW  YELLOW   APPearance CLEAR  CLEAR   Specific Gravity, Urine 1.020  1.005 - 1.030   pH 7.5  5.0 - 8.0   Glucose, UA NEGATIVE  NEGATIVE mg/dL   Hgb urine dipstick TRACE (*) NEGATIVE   Bilirubin Urine NEGATIVE  NEGATIVE   Ketones, ur NEGATIVE  NEGATIVE mg/dL   Protein, ur NEGATIVE  NEGATIVE mg/dL   Urobilinogen, UA 1.0  0.0 - 1.0 mg/dL   Nitrite NEGATIVE  NEGATIVE   Leukocytes, UA NEGATIVE  NEGATIVE  URINE MICROSCOPIC-ADD ON      Result Value Range   Squamous Epithelial / LPF  MANY (*)  RARE   WBC, UA 0-2  <3 WBC/hpf   RBC / HPF 3-6  <3 RBC/hpf   Bacteria, UA RARE  RARE  SEDIMENTATION RATE      Result Value Range   Sed Rate 58 (*) 0 - 22 mm/hr     DIAGNOSTIC STUDIES: Oxygen Saturation is 99% on room air, normal by my interpretation.    COORDINATION OF CARE: 11:21 PM-Advised pt that her symptoms are most likely due to a viral cause. Discussed treatment plan which includes medications, application of ice, CBC, CMP and UA with pt at bedside and pt agreed to plan.     Date: 10/20/2012   2214  Rate: 100  Rhythm: normal sinus rhythm  QRS Axis: normal  Intervals: normal  ST/T Wave abnormalities: normal  Conduction Disutrbances: none  Narrative Interpretation: anteroseptal infarct, age undetermined  Dg Hand Complete Right  10/21/2012  *RADIOLOGY REPORT*  Clinical Data: 42 year old female with swelling and pain.  RIGHT HAND - COMPLETE 3+ VIEW  Comparison: Right wrist 05/31/2012.  Right hand series 06/02/2004.  Findings: Diffuse soft tissue swelling identified in the right hand and wrist/distal forearm.  No subcutaneous gas. No radiopaque foreign body identified.  Stable and normal bone mineralization.  Distal right radius and ulna intact.  Carpal bone alignment within normal limits.  No acute fracture or dislocation.  Distal phalanges suboptimally visualized due to finger flexion on all views.  IMPRESSION: No acute osseous abnormality identified about the right hand. Diffuse soft tissue swelling.   Original Report Authenticated By: Erskine Speed, M.D.      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 Labs are normal except for the sed rate. Xray is normal except for soft tissue swelling. She presented with right hand and left ankle swelling and pain. Has developed left hand swelling since arrival. Pain has been addressed with morphine and solumedrol. She has had no other symptoms. She will follow up with Dr. Jorene Guest. Pt stable in ED with no significant deterioration in condition.The patient appears reasonably screened and/or stabilized for discharge and I doubt any other medical condition or other [facility] requiring further screening, evaluation, or treatment in the ED at this time prior to discharge.  Pt stable in ED with no significant deterioration in condition.The patient appears reasonably screened and/or stabilized for discharge and I doubt any other medical condition or other [facility] requiring further screening, evaluation, or treatment in the ED at this time prior to discharge.  MDM Reviewed: nursing note and vitals Interpretation: labs, ECG and x-ray           Nicoletta Dress. Colon Branch, MD 10/21/12 [phone]

## 2012-10-21 ENCOUNTER — Emergency Department (HOSPITAL_COMMUNITY): Payer: Self-pay

## 2012-10-21 MED ORDER — METHYLPREDNISOLONE SODIUM SUCC 125 MG IJ SOLR
125.0000 mg | Freq: Once | INTRAMUSCULAR | Status: AC
  Administered 2012-10-21: 125 mg via INTRAVENOUS
  Filled 2012-10-21: qty 2

## 2012-10-21 MED ORDER — HYDROCODONE-ACETAMINOPHEN 5-325 MG PO TABS: 1.0000 | ORAL_TABLET | ORAL | Status: DC | PRN

## 2012-10-21 MED ORDER — MORPHINE SULFATE 4 MG/ML IJ SOLN
2.0000 mg | Freq: Once | INTRAMUSCULAR | Status: AC
  Administered 2012-10-21: 2 mg via INTRAVENOUS
  Filled 2012-10-21: qty 1

## 2012-10-21 MED ORDER — PREDNISONE 10 MG PO TABS: 20.0000 mg | ORAL_TABLET | Freq: Every day | ORAL | Status: DC

## 2012-10-21 MED ORDER — ONDANSETRON HCL 4 MG/2ML IJ SOLN
4.0000 mg | Freq: Once | INTRAMUSCULAR | Status: AC
  Administered 2012-10-21: 4 mg via INTRAVENOUS
  Filled 2012-10-21: qty 2

## 2012-10-21 NOTE — ED Notes (Signed)
pts left hand is swelling now. I had to cut pts bracelet off. EDP aware.

## 2012-10-21 NOTE — Discharge Instructions (Signed)
Your labs would reveal that you have an inflammatory condition most likely due to a viral illness. Use the steroid as directed. Use ice for the swelling. Use the pain medicine. Follow up with your doctor.    Arthritis, Nonspecific Arthritis is pain, redness, warmth, or puffiness (inflammation) of a joint. The joint may be stiff or hurt when you move it. One or more joints may be affected. There are many types of arthritis. Your doctor may not know what type you have right away. The most common cause of arthritis is wear and tear on the joint (osteoarthritis). HOME CARE   Only take medicine as told by your doctor.  Rest the joint as much as possible.  Raise (elevate) your joint if it is puffy.  Use crutches if the painful joint is in your leg.  Drink enough fluids to keep your pee (urine) clear or pale yellow.  Follow your doctor's diet instructions.  Use cold packs for very bad joint pain for 10 to 15 minutes every hour. Ask your doctor if it is okay for you to use hot packs.  Exercise as told by your doctor.  Take a warm shower if you have stiffness in the morning.  Move your sore joints throughout the day. GET HELP RIGHT AWAY IF:   You have a fever.  You have very bad joint pain, puffiness, or redness.  You have many joints that are painful and puffy.  You are not getting better with treatment.  You have very bad back pain or leg weakness.  You cannot control when you poop (bowel movement) or pee (urinate).  You do not feel better in 24 hours or are getting worse.  You are having side effects from your medicine. MAKE SURE YOU:   Understand these instructions.  Will watch your condition.  Will get help right away if you are not doing well or get worse. Document Released: 08/30/2009 Document Revised: 12/05/2011 Document Reviewed: 08/30/2009 [facility] Patient Information 2013 [place], [place].

## 2013-05-05 ENCOUNTER — Encounter (HOSPITAL_COMMUNITY): Payer: Self-pay | Admitting: Emergency Medicine

## 2013-05-05 ENCOUNTER — Emergency Department (HOSPITAL_COMMUNITY): Payer: Self-pay

## 2013-05-05 ENCOUNTER — Emergency Department (HOSPITAL_COMMUNITY)
Admission: EM | Admit: 2013-05-05 | Discharge: 2013-05-05 | Disposition: A | Discharge status: Home / Self Care | Payer: Self-pay | Attending: Emergency Medicine | Admitting: Emergency Medicine

## 2013-05-05 DIAGNOSIS — Y929 Unspecified place or not applicable: Secondary | ICD-10-CM | POA: Insufficient documentation

## 2013-05-05 DIAGNOSIS — F3289 Other specified depressive episodes: Secondary | ICD-10-CM | POA: Insufficient documentation

## 2013-05-05 DIAGNOSIS — S300XXA Contusion of lower back and pelvis, initial encounter: Secondary | ICD-10-CM

## 2013-05-05 DIAGNOSIS — Y939 Activity, unspecified: Secondary | ICD-10-CM | POA: Insufficient documentation

## 2013-05-05 DIAGNOSIS — F329 Major depressive disorder, single episode, unspecified: Secondary | ICD-10-CM | POA: Insufficient documentation

## 2013-05-05 DIAGNOSIS — F17200 Nicotine dependence, unspecified, uncomplicated: Secondary | ICD-10-CM | POA: Insufficient documentation

## 2013-05-05 DIAGNOSIS — Z79899 Other long term (current) drug therapy: Secondary | ICD-10-CM | POA: Insufficient documentation

## 2013-05-05 DIAGNOSIS — R000 Tachycardia, unspecified: Secondary | ICD-10-CM | POA: Insufficient documentation

## 2013-05-05 DIAGNOSIS — Z91040 Latex allergy status: Secondary | ICD-10-CM | POA: Insufficient documentation

## 2013-05-05 DIAGNOSIS — F411 Generalized anxiety disorder: Secondary | ICD-10-CM | POA: Insufficient documentation

## 2013-05-05 DIAGNOSIS — W108XXA Fall (on) (from) other stairs and steps, initial encounter: Secondary | ICD-10-CM | POA: Insufficient documentation

## 2013-05-05 MED ORDER — HYDROCODONE-ACETAMINOPHEN 5-325 MG PO TABS
2.0000 | ORAL_TABLET | Freq: Once | ORAL | Status: AC
  Administered 2013-05-05: 2 via ORAL
  Filled 2013-05-05: qty 2

## 2013-05-05 MED ORDER — HYDROCODONE-ACETAMINOPHEN 5-325 MG PO TABS: 1.0000 | ORAL_TABLET | ORAL | Status: DC | PRN

## 2013-05-05 MED ORDER — DIAZEPAM 5 MG PO TABS
5.0000 mg | ORAL_TABLET | Freq: Once | ORAL | Status: AC
  Administered 2013-05-05: 5 mg via ORAL
  Filled 2013-05-05: qty 1

## 2013-05-05 MED ORDER — PREDNISONE 50 MG PO TABS
50.0000 mg | ORAL_TABLET | Freq: Every day | ORAL | Status: DC
  Administered 2013-05-05: 50 mg via ORAL
  Filled 2013-05-05: qty 1

## 2013-05-05 MED ORDER — METHOCARBAMOL 500 MG PO TABS: 500.0000 mg | ORAL_TABLET | Freq: Three times a day (TID) | ORAL | Status: DC

## 2013-05-05 NOTE — ED Provider Notes (Signed)
CSN: [id number]     Arrival date & time 05/05/13  1849 History   First MD Initiated Contact with Patient 05/05/13 2025     Chief Complaint  Patient presents with  . Fall  . Back Pain   (Consider location/radiation/quality/duration/timing/severity/associated sxs/prior Treatment) Patient is a 42 y.o. female presenting with fall and back pain. The history is provided by the patient.  Fall This is a new problem. The current episode started today. The problem occurs constantly. The problem has been gradually worsening. Associated symptoms include arthralgias. Pertinent negatives include no abdominal pain, chest pain, coughing or neck pain. Associated symptoms comments: Pain of the coccyx area.. The symptoms are aggravated by walking (sitting). She has tried nothing for the symptoms. The treatment provided no relief.  Back Pain Associated symptoms: no abdominal pain, no chest pain and no dysuria     Past Medical History  Diagnosis Date  . Anxiety   . Depression    Past Surgical History  Procedure Laterality Date  . Cesarean section    . Tubal ligation     History reviewed. No pertinent family history. History  Substance Use Topics  . Smoking status: Current Every Day Smoker -- 0.50 packs/day    Types: Cigarettes  . Smokeless tobacco: Not on file  . Alcohol Use: No   OB History   Grav Para Term Preterm Abortions TAB SAB Ect Mult Living                 Review of Systems  Constitutional: Negative for activity change.       All ROS Neg except as noted in HPI  HENT: Negative for nosebleeds.   Eyes: Negative for photophobia and discharge.  Respiratory: Negative for cough, shortness of breath and wheezing.   Cardiovascular: Negative for chest pain and palpitations.  Gastrointestinal: Negative for abdominal pain and blood in stool.  Genitourinary: Negative for dysuria, frequency and hematuria.  Musculoskeletal: Positive for arthralgias and back pain. Negative for neck pain.  Skin:  Negative.   Neurological: Negative for dizziness, seizures and speech difficulty.  Psychiatric/Behavioral: Negative for hallucinations and confusion. The patient is nervous/anxious.        Depression    Allergies  Coconut oil; Latex; Peach; Toradol; and Tramadol  Home Medications   Current Outpatient Rx  Name  Route  Sig  Dispense  Refill  . venlafaxine XR (EFFEXOR-XR) 150 MG 24 hr capsule   Oral   Take 150 mg by mouth daily with breakfast.          BP 166/93  Pulse 103  Temp(Src) 98.2 F (36.8 C) (Oral)  Resp 18  Ht 5\' 3"  (1.6 m)  Wt 225 lb (102.059 kg)  BMI 39.87 kg/m2  SpO2 100%  LMP 04/30/2013 Physical Exam  Nursing note and vitals reviewed. Constitutional: She is oriented to person, place, and time. She appears well-developed and well-nourished.  Non-toxic appearance.  HENT:  Head: Normocephalic.  Right Ear: Tympanic membrane and external ear normal.  Left Ear: Tympanic membrane and external ear normal.  Eyes: EOM and lids are normal. Pupils are equal, round, and reactive to light.  Neck: Normal range of motion. Neck supple. Carotid bruit is not present.  Cardiovascular: Regular rhythm, normal heart sounds, intact distal pulses and normal pulses.  Tachycardia present.   Pulmonary/Chest: Breath sounds normal. No respiratory distress.  Abdominal: Soft. Bowel sounds are normal. There is no tenderness. There is no guarding.  Musculoskeletal: Normal range of motion.  There is pain  to palpation of the lower back extending to the coccyx area. There is no bruising noted. There is no palpable deformity appreciated.  Lymphadenopathy:       Head (right side): No submandibular adenopathy present.       Head (left side): No submandibular adenopathy present.    She has no cervical adenopathy.  Neurological: She is alert and oriented to person, place, and time. She has normal strength. No cranial nerve deficit or sensory deficit.  No gross neurologic deficits appreciated of the  lower extremities.  Skin: Skin is warm and dry.  Psychiatric: She has a normal mood and affect. Her speech is normal.    ED Course  Procedures (including critical care time) Labs Review Labs Reviewed - No data to display Imaging Review Dg Sacrum/coccyx  05/05/2013   CLINICAL DATA:  Patient fell down steps with pain in sacrum and coccyx  EXAM: SACRUM AND COCCYX - 2+ VIEW  COMPARISON:  None.  FINDINGS: There is no evidence of fracture or other focal bone lesions  IMPRESSION: Negative.   Electronically Signed   By: Esperanza Heir M.D.   On: 05/05/2013 21:45    EKG Interpretation   None       MDM  No diagnosis found. *I have reviewed nursing notes, vital signs, and all appropriate lab and imaging results for this patient.  X-ray of the sacrum and coccyx is negative for fracture or dislocation. Patient advised to use ice pack to the affected area. Prescription for Norco and Robaxin given to the patient. Patient is to followup with Dr. Hilda Lias if not improving.  Kathie Dike, PA-C 05/05/13 2200

## 2013-05-05 NOTE — ED Notes (Signed)
Fell down several steps 7am, pain coccyx region. "hurts to sit",

## 2013-05-05 NOTE — ED Notes (Signed)
Pt reports that she fell down the steps about 7 this morning.  Pt reporting pain in tailbone area.

## 2013-05-05 NOTE — ED Provider Notes (Signed)
Medical screening examination/treatment/procedure(s) were performed by non-physician practitioner and as supervising physician I was immediately available for consultation/collaboration.  EKG Interpretation   None         Yocelin Vanlue M Giankarlo Leamer, MD 05/05/13 2333 

## 2013-06-19 HISTORY — PX: BREAST BIOPSY: SHX20

## 2013-12-24 ENCOUNTER — Emergency Department (HOSPITAL_COMMUNITY)
Admission: EM | Admit: 2013-12-24 | Discharge: 2013-12-24 | Disposition: A | Discharge status: Home / Self Care | Payer: Self-pay | Attending: Emergency Medicine | Admitting: Emergency Medicine

## 2013-12-24 ENCOUNTER — Encounter (HOSPITAL_COMMUNITY): Payer: Self-pay | Admitting: Emergency Medicine

## 2013-12-24 ENCOUNTER — Emergency Department (HOSPITAL_COMMUNITY): Payer: Self-pay

## 2013-12-24 DIAGNOSIS — F3289 Other specified depressive episodes: Secondary | ICD-10-CM | POA: Insufficient documentation

## 2013-12-24 DIAGNOSIS — M778 Other enthesopathies, not elsewhere classified: Secondary | ICD-10-CM

## 2013-12-24 DIAGNOSIS — F329 Major depressive disorder, single episode, unspecified: Secondary | ICD-10-CM | POA: Insufficient documentation

## 2013-12-24 DIAGNOSIS — F411 Generalized anxiety disorder: Secondary | ICD-10-CM | POA: Insufficient documentation

## 2013-12-24 DIAGNOSIS — M65849 Other synovitis and tenosynovitis, unspecified hand: Principal | ICD-10-CM

## 2013-12-24 DIAGNOSIS — Z79899 Other long term (current) drug therapy: Secondary | ICD-10-CM | POA: Insufficient documentation

## 2013-12-24 DIAGNOSIS — F17200 Nicotine dependence, unspecified, uncomplicated: Secondary | ICD-10-CM | POA: Insufficient documentation

## 2013-12-24 DIAGNOSIS — M65839 Other synovitis and tenosynovitis, unspecified forearm: Secondary | ICD-10-CM | POA: Insufficient documentation

## 2013-12-24 MED ORDER — IBUPROFEN 600 MG PO TABS: 600.0000 mg | ORAL_TABLET | Freq: Four times a day (QID) | ORAL | Status: DC | PRN

## 2013-12-24 MED ORDER — HYDROCODONE-ACETAMINOPHEN 5-325 MG PO TABS: 1.0000 | ORAL_TABLET | ORAL | Status: DC | PRN

## 2013-12-24 NOTE — ED Notes (Signed)
Pt c/o pain and knot on r wrist x 3 days.  Denies injury.  Says hurts to open and close fist.

## 2013-12-24 NOTE — Discharge Instructions (Signed)
Repetitive Strain Injuries  Repetitive strain injuries (RSIs) result from overuse or misuse of soft tissues including muscles, tendons, or nerves. Tendons are the cord-like structures that attach muscles to bones. RSIs can affect almost any part of the body. However, RSIs are most common in the arms (thumbs, wrists, elbows, shoulders) and legs (ankles, knees). Common medical conditions that are often caused by repetitive strain include carpal tunnel syndrome, tennis or golfer's elbow, bursitis, and tendonitis. If RSIs are treated early, and therepeated activity is reduced or removed, the severity and length of your problems can usually be reduced. RSIs are also called cumulative trauma disorders (CTD).   CAUSES   Many RSIs occur due to repeating the same activity at work over weeks or months without sufficient rest, such as prolonged typing. RSIs also commonly occur when a hobby or sport is done repeatedly without sufficient rest. RSIs can also occur due to repeated strain or stress on a body part in someone who has one or more risk factors for RSIs.  RISK FACTORS  Workplace risk factors   Frequent computer use, especially if your workstation is not adjusted for your body type.   Infrequent rest breaks.   Working in a high-pressure environment.   Working at a fast pace.   Repeating the same motion, such as frequent typing.   Working in an awkward position or holding the same position for a long time.   Forceful movements such as lifting, pulling, or pushing.   Vibration caused by using power tools.   Working in cold temperatures.   Job stress.  Personal risk factors   Poor posture.   Being loose-jointed.   Not exercising regularly.   Being overweight.   Arthritis, diabetes, thyroid problems, or other long-term (chronic)medical conditions.   Vitamin deficiencies.   Keeping your fingernails long.   An unhealthy, stressful, or inactive lifestyle.   Not sleeping well.  SYMPTOMS   Symptoms often  begin at work but become more noticeable after the repeated stress has ended. For example, you may develop fatigue or soreness in your wrist while typingat work, and at night you may develop numbness and tingling in your fingers. Common symptoms include:    Burning, shooting, or aching pain, especially in the fingers, palms, wrists, forearms, or shoulders.   Tenderness.   Swelling.   Tingling, numbness, or loss of feeling.   Pain with certain activities, such as turning a doorknob or reaching above your head.   Weakness, heaviness, or loss of coordination in yourhand.   Muscle spasms or tightness.  In some cases, symptoms can become so intense that it is difficult to perform everyday tasks. Symptoms that do not improve with rest may indicate a more serious condition.   DIAGNOSIS   Your caregiver may determine the type ofRSI you have based on your medical evaluation and a description of your activities.   TREATMENT   Treatment depends on the severity and type of RSI you have. Your caregiver may recommend rest for the affected body part, medicines, and physical or occupational therapy to reduce pain, swelling, and soreness. Discuss the activities you do repeatedly with your caregiver. Your caregiver can help you decide whether you need to change your activities. An RSI may take months or years to heal, especially if the affected body part gets insufficient rest. In some cases, such as severe carpal tunnel syndrome, surgery may be recommended.  PREVENTION   Talk with your supervisor to make sure you have the proper equipment   cushion in the curve of your lower back.  Shoulders and arms relaxed and at your sides.  Neck relaxed and not bent forwards or backwards.  Your desk and computer workstation  properly adjusted to your body type.  Your chair adjusted so there is no excess pressure on the back of your thighs.  The keyboard resting above your thighs. You should be able to reach the keys with your elbows at your side, bent at a right angle. Your arms should be supported on forearm rests, with your forearms parallel to the ground.  The computer mouse within easy reach.  The monitor directly in front of you, so that your eyes are aligned with the top of the screen. The screen should be about 15 to 25 inches from your eyes.  While typing, keep your wrist straight, in a neutral position. Move your entire arm when you move your mouse or when typing hard-to-reach keys.  Only use your computer as much as you need to for work. Do not use it during breaks.  Take breaks often from any repeated activity. Alternate with another task which requires you to use different muscles, or rest at least once every hour.  Change positions regularly. If you spend a lot of time sitting, get up, walk around, and stretch.  Do not hold pens or pencils tightly when writing.  Exercise regularly.  Maintain a normal weight.  Eat a diet with plenty of vegetables, whole grains, and fruit.  Get sufficient, restful sleep. HOME CARE INSTRUCTIONS  If your caregiver prescribed medicine to help reduce swelling, take it as directed.  Only take over-the-counter or prescription medicines for pain, discomfort, or fever as directed by your caregiver.  Reduce, and if needed, stopthe activities that are causing your problems until you have no further symptoms.If your symptoms are work-related, you may need to talk to your supervisor about changing your activities.  When symptoms develop, put ice or a cold pack on the aching area.  Put ice in a plastic bag.  Place a towel between your skin and the bag.  Leave the ice on for 15-20 minutes.  If you were given a splint to keep your wrist from bending, wear it as  instructed. It is important to wear the splint at night. Use the splint for as long as your caregiver recommends. SEEK MEDICAL CARE IF:  You develop new problems.  Your problems do not get better with medicine. MAKE SURE YOU:  Understand these instructions.  Will watch your condition.  Will get help right away if you are not doing well or get worse. Document Released: 05/26/2002 Document Revised: 12/05/2011 Document Reviewed: 07/27/2011 [facility] Patient Information 2015 [place], [place]. This information is not intended to replace advice given to you by your health care provider. Make sure you discuss any questions you have with your health care provider.   You may take the hydrocodone prescribed for pain relief.  This will make you drowsy - do not drive within 4 hours of taking this medication.

## 2013-12-25 NOTE — ED Provider Notes (Signed)
CSN: [id number]     Arrival date & time 12/24/13  1102 History   First MD Initiated Contact with Patient 12/24/13 1119     Chief Complaint  Patient presents with  . Wrist Pain     (Consider location/radiation/quality/duration/timing/severity/associated sxs/prior Treatment) HPI   [name] is a right handed 43 y.o. 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 She has increased pain at her dorsal wrist which is worsened with attempts to extend her 3rd, 4th and 5th fingers and is improved when she keeps these fingers slightly curled, although making a complete fist is also pain producing.  She denies prior or recent injury of this extremity.  She has taken tylenol without relief of pain.    Past Medical History  Diagnosis Date  . Anxiety   . Depression    Past Surgical History  Procedure Laterality Date  . Cesarean section    . Tubal ligation     No family history on file. History  Substance Use Topics  . Smoking status: Current Every Day Smoker -- 0.50 packs/day    Types: Cigarettes  . Smokeless tobacco: Not on file  . Alcohol Use: No   OB History   Grav Para Term Preterm Abortions TAB SAB Ect Mult Living                 Review of Systems  Constitutional: Negative for fever.  Musculoskeletal: Positive for arthralgias and joint swelling. Negative for myalgias.  Neurological: Negative for weakness and numbness.      Allergies  Coconut oil; Latex; Peach; Toradol; and Tramadol  Home Medications   Prior to Admission medications   Medication Sig Start Date End Date Taking? Authorizing Provider  clonazePAM (KLONOPIN) 1 MG tablet Take 1 mg by mouth 3 (three) times daily.   Yes Historical Provider, MD  venlafaxine (EFFEXOR) 75 MG tablet Take 75 mg by mouth 2 (two) times daily.   Yes Historical Provider, MD  HYDROcodone-acetaminophen (NORCO/VICODIN) 5-325 MG per tablet  Take 1 tablet by mouth every 4 (four) hours as needed. 12/24/13   Burgess Amor, PA-C  ibuprofen (ADVIL,MOTRIN) 600 MG tablet Take 1 tablet (600 mg total) by mouth every 6 (six) hours as needed. 12/24/13   Burgess Amor, PA-C   BP 138/88  Pulse 101  Temp(Src) 98.2 F (36.8 C) (Oral)  Resp 18  Ht 5\' 3"  (1.6 m)  Wt 228 lb (103.42 kg)  BMI 40.40 kg/m2  SpO2 100%  LMP 12/16/2013 Physical Exam  Constitutional: She appears well-developed and well-nourished.  HENT:  Head: Atraumatic.  Neck: Normal range of motion.  Cardiovascular:  Pulses:      Radial pulses are 2+ on the right side, and 2+ on the left side.  Pulses equal bilaterally  Musculoskeletal: She exhibits edema and tenderness.       Right wrist: She exhibits decreased range of motion, bony tenderness and swelling. She exhibits no crepitus and no deformity.  ttp just proximal to the right ulnar styloid with edema, no erythema.  Pain is worsened with resisted and passive extension of long through 5th fingers.  Less than 2 sec distal cap refill.  nontender proximal forearm and hand.  Neurological: She is alert. She has normal strength. She displays normal reflexes. No sensory deficit.  Skin: Skin is warm and dry.  Skin intact. No lesions or trauma.  Psychiatric: She has a normal mood  and affect.    ED Course  Procedures (including critical care time) Labs Review Labs Reviewed - No data to display  Imaging Review Dg Wrist Complete Right  12/24/2013   CLINICAL DATA:  Pain and swelling at wrist, pain and knot at ulnar side, moved boxes at work 3 days ago  EXAM: RIGHT WRIST - COMPLETE 3+ VIEW  COMPARISON:  RIGHT hand radiographs 10/21/2012, RIGHT wrist radiographs 05/31/2012  FINDINGS: Osseous mineralization normal.  Joint spaces preserved.  No acute fracture, dislocation or bone destruction.  Question soft tissue swelling or prominence at volar aspect of distal forearm, unchanged from 2013.  IMPRESSION: No acute osseous abnormalities.    Electronically Signed   By: Ulyses SouthwardMark  Boles M.D.   On: 12/24/2013 12:08     EKG Interpretation None      MDM   Final diagnoses:  Right wrist tendonitis    velcro splint applied, RICE, ibuprofen, hydrocodone.  Encouraged recheck with ortho 1 week if not improved,  Referral given.  Patients labs and/or radiological studies were viewed and considered during the medical decision making and disposition process.     Burgess AmorJulie Jiraiya Mcewan, PA-C 12/25/13 [phone]

## 2013-12-25 NOTE — ED Provider Notes (Signed)
Medical screening examination/treatment/procedure(s) were performed by non-physician practitioner and as supervising physician I was immediately available for consultation/collaboration.   EKG Interpretation None        Rajan Burgard M Kadyn Chovan, DO 12/25/13 0751 

## 2014-01-06 ENCOUNTER — Emergency Department (HOSPITAL_COMMUNITY): Payer: Self-pay

## 2014-01-06 ENCOUNTER — Emergency Department (HOSPITAL_COMMUNITY)
Admission: EM | Admit: 2014-01-06 | Discharge: 2014-01-06 | Disposition: A | Discharge status: Home / Self Care | Payer: Self-pay | Attending: Emergency Medicine | Admitting: Emergency Medicine

## 2014-01-06 ENCOUNTER — Encounter (HOSPITAL_COMMUNITY): Payer: Self-pay | Admitting: Emergency Medicine

## 2014-01-06 DIAGNOSIS — Z79899 Other long term (current) drug therapy: Secondary | ICD-10-CM | POA: Insufficient documentation

## 2014-01-06 DIAGNOSIS — F17200 Nicotine dependence, unspecified, uncomplicated: Secondary | ICD-10-CM | POA: Insufficient documentation

## 2014-01-06 DIAGNOSIS — Z91040 Latex allergy status: Secondary | ICD-10-CM | POA: Insufficient documentation

## 2014-01-06 DIAGNOSIS — M542 Cervicalgia: Secondary | ICD-10-CM | POA: Insufficient documentation

## 2014-01-06 DIAGNOSIS — F411 Generalized anxiety disorder: Secondary | ICD-10-CM | POA: Insufficient documentation

## 2014-01-06 DIAGNOSIS — F3289 Other specified depressive episodes: Secondary | ICD-10-CM | POA: Insufficient documentation

## 2014-01-06 DIAGNOSIS — F329 Major depressive disorder, single episode, unspecified: Secondary | ICD-10-CM | POA: Insufficient documentation

## 2014-01-06 MED ORDER — CYCLOBENZAPRINE HCL 5 MG PO TABS: 5.0000 mg | ORAL_TABLET | Freq: Two times a day (BID) | ORAL | Status: DC | PRN

## 2014-01-06 MED ORDER — HYDROCODONE-ACETAMINOPHEN 5-325 MG PO TABS: 1.0000 | ORAL_TABLET | Freq: Four times a day (QID) | ORAL | Status: DC | PRN

## 2014-01-06 MED ORDER — CYCLOBENZAPRINE HCL 10 MG PO TABS
5.0000 mg | ORAL_TABLET | Freq: Once | ORAL | Status: AC
  Administered 2014-01-06: 5 mg via ORAL
  Filled 2014-01-06: qty 1

## 2014-01-06 NOTE — ED Notes (Signed)
Pt c/o waking up with neck pain since yesterday.  Reports pain worse this morning, radiating down r arm.  Says hurts neck when swallows.  Denies injury.

## 2014-01-06 NOTE — ED Provider Notes (Signed)
CSN: [id number]     Arrival date & time 01/06/14  1056 History   First MD Initiated Contact with Patient 01/06/14 1123     Chief Complaint  Patient presents with  . Neck Pain     (Consider location/radiation/quality/duration/timing/severity/associated sxs/prior Treatment) HPI Comments: Pt states that she woke up with neck pain that started yesterday. Pt states that she has pain when she swallows. Denies numbness, weakness, fever or history of drug use. Has tried nsaids with mild relief. History of similar symptoms. Pain with moving the area side to side. No known injury  The history is provided by the patient. No language interpreter was used.    Past Medical History  Diagnosis Date  . Anxiety   . Depression    Past Surgical History  Procedure Laterality Date  . Cesarean section    . Tubal ligation     No family history on file. History  Substance Use Topics  . Smoking status: Current Every Day Smoker -- 0.50 packs/day    Types: Cigarettes  . Smokeless tobacco: Not on file  . Alcohol Use: No   OB History   Grav Para Term Preterm Abortions TAB SAB Ect Mult Living                 Review of Systems  Constitutional: Negative.   Respiratory: Negative.   Cardiovascular: Negative.       Allergies  Coconut oil; Latex; Peach; Toradol; and Tramadol  Home Medications   Prior to Admission medications   Medication Sig Start Date End Date Taking? Authorizing Provider  clonazePAM (KLONOPIN) 1 MG tablet Take 1 mg by mouth 3 (three) times daily.    Historical Provider, MD  HYDROcodone-acetaminophen (NORCO/VICODIN) 5-325 MG per tablet Take 1 tablet by mouth every 4 (four) hours as needed. 12/24/13   Burgess AmorJulie Idol, PA-C  ibuprofen (ADVIL,MOTRIN) 600 MG tablet Take 1 tablet (600 mg total) by mouth every 6 (six) hours as needed. 12/24/13   Burgess AmorJulie Idol, PA-C  venlafaxine (EFFEXOR) 75 MG tablet Take 75 mg by mouth 2 (two) times daily.    Historical Provider, MD   BP 135/85  Pulse 94   Temp(Src) 98.5 F (36.9 C) (Oral)  Resp 18  Ht 5\' 3"  (1.6 m)  Wt 222 lb (100.699 kg)  BMI 39.34 kg/m2  SpO2 99%  LMP 12/16/2013 Physical Exam  Nursing note and vitals reviewed. Constitutional: She is oriented to person, place, and time. She appears well-developed and well-nourished.  HENT:  Head: Normocephalic and atraumatic.  Right Ear: External ear normal.  Left Ear: External ear normal.  No cervical adenopathy  Cardiovascular: Normal rate and regular rhythm.   Pulmonary/Chest: Effort normal and breath sounds normal.  Musculoskeletal: Normal range of motion.       Cervical back: She exhibits tenderness, bony tenderness and spasm. She exhibits no deformity, no laceration and normal pulse.  Neurological: She is alert and oriented to person, place, and time. She exhibits normal muscle tone. Coordination normal.  Skin: Skin is warm and dry.  Psychiatric: She has a normal mood and affect.    ED Course  Procedures (including critical care time) Labs Review Labs Reviewed - No data to display  Imaging Review Dg Cervical Spine Complete  01/06/2014   CLINICAL DATA:  Neck pain along the left side  EXAM: CERVICAL SPINE  4+ VIEWS  COMPARISON:  None.  FINDINGS: There is no evidence of cervical spine fracture or prevertebral soft tissue swelling. Alignment is normal. No other  significant bone abnormalities are identified. Loss of the normal cervical lordosis with reversal. Disc spaces are maintained. Neural foramina are patent.  IMPRESSION: Negative cervical spine radiographs.   Electronically Signed   By: Elige Ko   On: 01/06/2014 12:26     EKG Interpretation None      MDM   Final diagnoses:  Neck pain    Will treat with hydrocodone and flexeril. Pt is neurologically intact. Doubt infectious process    Teressa Lower, NP 01/06/14 1240

## 2014-01-06 NOTE — Discharge Instructions (Signed)
Cervical Sprain A cervical sprain is when the tissues (ligaments) that hold the neck bones in place stretch or tear. HOME CARE   Put ice on the injured area.  Put ice in a plastic bag.  Place a towel between your skin and the bag.  Leave the ice on for 15-20 minutes, 3-4 times a day.  You may have been given a collar to wear. This collar keeps your neck from moving while you heal.  Do not take the collar off unless told by your doctor.  If you have long hair, keep it outside of the collar.  Ask your doctor before changing the position of your collar. You may need to change its position over time to make it more comfortable.  If you are allowed to take off the collar for cleaning or bathing, follow your doctor's instructions on how to do it safely.  Keep your collar clean by wiping it with mild soap and water. Dry it completely. If the collar has removable pads, remove them every 1-2 days to hand wash them with soap and water. Allow them to air dry. They should be dry before you wear them in the collar.  Do not drive while wearing the collar.  Only take medicine as told by your doctor.  Keep all doctor visits as told.  Keep all physical therapy visits as told.  Adjust your work station so that you have good posture while you work.  Avoid positions and activities that make your problems worse.  Warm up and stretch before being active. GET HELP IF:  Your pain is not controlled with medicine.  You cannot take less pain medicine over time as planned.  Your activity level does not improve as expected. GET HELP RIGHT AWAY IF:   You are bleeding.  Your stomach is upset.  You have an allergic reaction to your medicine.  You develop new problems that you cannot explain.  You lose feeling (become numb) or you cannot move any part of your body (paralysis).  You have tingling or weakness in any part of your body.  Your symptoms get worse. Symptoms include:  Pain,  soreness, stiffness, puffiness (swelling), or a burning feeling in your neck.  Pain when your neck is touched.  Shoulder or upper back pain.  Limited ability to move your neck.  Headache.  Dizziness.  Your hands or arms feel week, lose feeling, or tingle.  Muscle spasms.  Difficulty swallowing or chewing. MAKE SURE YOU:   Understand these instructions.  Will watch your condition.  Will get help right away if you are not doing well or get worse. Document Released: 11/22/2007 Document Revised: 02/05/2013 Document Reviewed: 12/11/2012 Exit[employer] Patient Information 2015 [employer]. This information is not intended to replace advice given to you by your health care provider. Make sure you discuss any questions you have with your health care provider.  

## 2014-01-07 NOTE — ED Provider Notes (Signed)
Medical screening examination/treatment/procedure(s) were performed by non-physician practitioner and as supervising physician I was immediately available for consultation/collaboration.   EKG Interpretation None        Dekota Shenk, MD 01/07/14 0740 

## 2014-02-02 ENCOUNTER — Emergency Department (HOSPITAL_COMMUNITY): Payer: Self-pay

## 2014-02-02 ENCOUNTER — Emergency Department (HOSPITAL_COMMUNITY)
Admission: EM | Admit: 2014-02-02 | Discharge: 2014-02-02 | Disposition: A | Discharge status: Home / Self Care | Payer: Self-pay | Attending: Emergency Medicine | Admitting: Emergency Medicine

## 2014-02-02 ENCOUNTER — Encounter (HOSPITAL_COMMUNITY): Payer: Self-pay | Admitting: Emergency Medicine

## 2014-02-02 DIAGNOSIS — Z9889 Other specified postprocedural states: Secondary | ICD-10-CM | POA: Insufficient documentation

## 2014-02-02 DIAGNOSIS — Z3202 Encounter for pregnancy test, result negative: Secondary | ICD-10-CM | POA: Insufficient documentation

## 2014-02-02 DIAGNOSIS — F329 Major depressive disorder, single episode, unspecified: Secondary | ICD-10-CM | POA: Insufficient documentation

## 2014-02-02 DIAGNOSIS — F411 Generalized anxiety disorder: Secondary | ICD-10-CM | POA: Insufficient documentation

## 2014-02-02 DIAGNOSIS — D649 Anemia, unspecified: Secondary | ICD-10-CM | POA: Insufficient documentation

## 2014-02-02 DIAGNOSIS — R1031 Right lower quadrant pain: Secondary | ICD-10-CM | POA: Insufficient documentation

## 2014-02-02 DIAGNOSIS — Z9851 Tubal ligation status: Secondary | ICD-10-CM | POA: Insufficient documentation

## 2014-02-02 DIAGNOSIS — N898 Other specified noninflammatory disorders of vagina: Secondary | ICD-10-CM | POA: Insufficient documentation

## 2014-02-02 DIAGNOSIS — Z91040 Latex allergy status: Secondary | ICD-10-CM | POA: Insufficient documentation

## 2014-02-02 DIAGNOSIS — Z791 Long term (current) use of non-steroidal anti-inflammatories (NSAID): Secondary | ICD-10-CM | POA: Insufficient documentation

## 2014-02-02 DIAGNOSIS — F17200 Nicotine dependence, unspecified, uncomplicated: Secondary | ICD-10-CM | POA: Insufficient documentation

## 2014-02-02 DIAGNOSIS — Z79899 Other long term (current) drug therapy: Secondary | ICD-10-CM | POA: Insufficient documentation

## 2014-02-02 DIAGNOSIS — N939 Abnormal uterine and vaginal bleeding, unspecified: Secondary | ICD-10-CM

## 2014-02-02 DIAGNOSIS — F3289 Other specified depressive episodes: Secondary | ICD-10-CM | POA: Insufficient documentation

## 2014-02-02 LAB — URINE MICROSCOPIC-ADD ON

## 2014-02-02 LAB — COMPREHENSIVE METABOLIC PANEL
ALK PHOS: 90 U/L (ref 39–117)
ALT: 13 U/L (ref 0–35)
AST: 15 U/L (ref 0–37)
Albumin: 2.8 g/dL — ABNORMAL LOW (ref 3.5–5.2)
Anion gap: 15 (ref 5–15)
BILIRUBIN TOTAL: 0.3 mg/dL (ref 0.3–1.2)
BUN: 12 mg/dL (ref 6–23)
CO2: 23 meq/L (ref 19–32)
Calcium: 8.9 mg/dL (ref 8.4–10.5)
Chloride: 106 mEq/L (ref 96–112)
Creatinine, Ser: 0.67 mg/dL (ref 0.50–1.10)
GLUCOSE: 125 mg/dL — AB (ref 70–99)
POTASSIUM: 3.5 meq/L — AB (ref 3.7–5.3)
SODIUM: 144 meq/L (ref 137–147)
Total Protein: 6.1 g/dL (ref 6.0–8.3)

## 2014-02-02 LAB — URINALYSIS, ROUTINE W REFLEX MICROSCOPIC
BILIRUBIN URINE: NEGATIVE
GLUCOSE, UA: NEGATIVE mg/dL
Ketones, ur: NEGATIVE mg/dL
LEUKOCYTES UA: NEGATIVE
NITRITE: NEGATIVE
PH: 5.5 (ref 5.0–8.0)
Protein, ur: NEGATIVE mg/dL
Urobilinogen, UA: 0.2 mg/dL (ref 0.0–1.0)

## 2014-02-02 LAB — PREGNANCY, URINE: PREG TEST UR: NEGATIVE

## 2014-02-02 LAB — CBC
HEMATOCRIT: 36 % (ref 36.0–46.0)
HEMOGLOBIN: 11.7 g/dL — AB (ref 12.0–15.0)
MCH: 30.4 pg (ref 26.0–34.0)
MCHC: 32.5 g/dL (ref 30.0–36.0)
MCV: 93.5 fL (ref 78.0–100.0)
Platelets: 351 10*3/uL (ref 150–400)
RBC: 3.85 MIL/uL — AB (ref 3.87–5.11)
RDW: 14.3 % (ref 11.5–15.5)
WBC: 9.1 10*3/uL (ref 4.0–10.5)

## 2014-02-02 LAB — PROTIME-INR
INR: 0.98 (ref 0.00–1.49)
Prothrombin Time: 13 seconds (ref 11.6–15.2)

## 2014-02-02 LAB — WET PREP, GENITAL
CLUE CELLS WET PREP: NONE SEEN
TRICH WET PREP: NONE SEEN
WBC WET PREP: NONE SEEN
YEAST WET PREP: NONE SEEN

## 2014-02-02 LAB — APTT: APTT: 28 s (ref 24–37)

## 2014-02-02 MED ORDER — SODIUM CHLORIDE 0.9 % IV BOLUS (SEPSIS)
1000.0000 mL | Freq: Once | INTRAVENOUS | Status: AC
  Administered 2014-02-02: 1000 mL via INTRAVENOUS

## 2014-02-02 MED ORDER — FENTANYL CITRATE 0.05 MG/ML IJ SOLN
50.0000 ug | Freq: Once | INTRAMUSCULAR | Status: AC
  Administered 2014-02-02: 50 ug via INTRAVENOUS
  Filled 2014-02-02: qty 2

## 2014-02-02 MED ORDER — OXYCODONE-ACETAMINOPHEN 5-325 MG PO TABS: 1.0000 | ORAL_TABLET | ORAL | Status: DC | PRN

## 2014-02-02 MED ORDER — ACETAMINOPHEN 500 MG PO TABS
1000.0000 mg | ORAL_TABLET | Freq: Once | ORAL | Status: AC
  Administered 2014-02-02: 1000 mg via ORAL
  Filled 2014-02-02: qty 2

## 2014-02-02 NOTE — ED Notes (Signed)
Pt reports heavy vaginal bleeding with large clots, states she is not suppose to start her normal period until next week. Pt states she is also having abd cramping. Pt report 1 pad per 2 hours usage

## 2014-02-02 NOTE — Discharge Instructions (Signed)
If your abdominal pain worsens, or you develop fevers or vomiting return to the ER for evaluation. Otherwise continue to take iron and follow up with OB/GYN.    Abdominal Pain Many things can cause abdominal pain. Usually, abdominal pain is not caused by a disease and will improve without treatment. It can often be observed and treated at home. Your health care provider will do a physical exam and possibly order blood tests and X-rays to help determine the seriousness of your pain. However, in many cases, more time must pass before a clear cause of the pain can be found. Before that point, your health care provider may not know if you need more testing or further treatment. HOME CARE INSTRUCTIONS  Monitor your abdominal pain for any changes. The following actions may help to alleviate any discomfort you are experiencing:  Only take over-the-counter or prescription medicines as directed by your health care provider.  Do not take laxatives unless directed to do so by your health care provider.  Try a clear liquid diet (broth, tea, or water) as directed by your health care provider. Slowly move to a bland diet as tolerated. SEEK MEDICAL CARE IF:  You have unexplained abdominal pain.  You have abdominal pain associated with nausea or diarrhea.  You have pain when you urinate or have a bowel movement.  You experience abdominal pain that wakes you in the night.  You have abdominal pain that is worsened or improved by eating food.  You have abdominal pain that is worsened with eating fatty foods.  You have a fever. SEEK IMMEDIATE MEDICAL CARE IF:   Your pain does not go away within 2 hours.  You keep throwing up (vomiting).  Your pain is felt only in portions of the abdomen, such as the right side or the left lower portion of the abdomen.  You pass bloody or black tarry stools. MAKE SURE YOU:  Understand these instructions.   Will watch your condition.   Will get help right  away if you are not doing well or get worse.  Document Released: 03/15/2005 Document Revised: 06/10/2013 Document Reviewed: 02/12/2013 [facility] Patient Information 2015 [place], [place]. This information is not intended to replace advice given to you by your health care provider. Make sure you discuss any questions you have with your health care provider.   Menorrhagia Menorrhagia is the medical term for when your menstrual periods are heavy or last longer than usual. With menorrhagia, every period you have may cause enough blood loss and cramping that you are unable to maintain your usual activities. CAUSES  In some cases, the cause of heavy periods is unknown, but a number of conditions may cause menorrhagia. Common causes include:  A problem with the hormone-producing thyroid gland (hypothyroid).  Noncancerous growths in the uterus (polyps or fibroids).  An imbalance of the estrogen and progesterone hormones.  One of your ovaries not releasing an egg during one or more months.  Side effects of having an intrauterine device (IUD).  Side effects of some medicines, such as anti-inflammatory medicines or blood thinners.  A bleeding disorder that stops your blood from clotting normally. SIGNS AND SYMPTOMS  During a normal period, bleeding lasts between 4 and 8 days. Signs that your periods are too heavy include:  You routinely have to change your pad or tampon every 1 or 2 hours because it is completely soaked.  You pass blood clots larger than 1 inch (2.5 cm) in size.  You have bleeding for  more than 7 days.  You need to use pads and tampons at the same time because of heavy bleeding.  You need to wake up to change your pads or tampons during the night.  You have symptoms of anemia, such as tiredness, fatigue, or shortness of breath. DIAGNOSIS  Your health care provider will perform a physical exam and ask you questions about your symptoms and menstrual history. Other tests may  be ordered based on what the health care provider finds during the exam. These tests can include:  Blood tests. Blood tests are used to check if you are pregnant or have hormonal changes, a bleeding or thyroid disorder, low iron levels (anemia), or other problems.  Endometrial biopsy. Your health care provider takes a sample of tissue from the inside of your uterus to be examined under a microscope.  Pelvic ultrasound. This test uses sound waves to make a picture of your uterus, ovaries, and vagina. The pictures can show if you have fibroids or other growths.  Hysteroscopy. For this test, your health care provider will use a small telescope to look inside your uterus. Based on the results of your initial tests, your health care provider may recommend further testing. TREATMENT  Treatment may not be needed. If it is needed, your health care provider may recommend treatment with one or more medicines first. If these do not reduce bleeding enough, a surgical treatment might be an option. The best treatment for you will depend on:   Whether you need to prevent pregnancy.  Your desire to have children in the future.  The cause and severity of your bleeding.  Your opinion and personal preference.  Medicines for menorrhagia may include:  Birth control methods that use hormones. These include the pill, skin patch, vaginal ring, shots that you get every 3 months, hormonal IUD, and implant. These treatments reduce bleeding during your menstrual period.  Medicines that thicken blood and slow bleeding.  Medicines that reduce swelling, such as ibuprofen.  Medicines that contain a synthetic hormone called progestin.   Medicines that make the ovaries stop working for a short time.  You may need surgical treatment for menorrhagia if the medicines are unsuccessful. Treatment options include:  Dilation and curettage (D&C). In this procedure, your health care provider opens (dilates) your cervix  and then scrapes or suctions tissue from the lining of your uterus to reduce menstrual bleeding.  Operative hysteroscopy. This procedure uses a tiny tube with a light (hysteroscope) to view your uterine cavity and can help in the surgical removal of a polyp that may be causing heavy periods.  Endometrial ablation. Through various techniques, your health care provider permanently destroys the entire lining of your uterus (endometrium). After endometrial ablation, most women have little or no menstrual flow. Endometrial ablation reduces your ability to become pregnant.  Endometrial resection. This surgical procedure uses an electrosurgical wire loop to remove the lining of the uterus. This procedure also reduces your ability to become pregnant.  Hysterectomy. Surgical removal of the uterus and cervix is a permanent procedure that stops menstrual periods. Pregnancy is not possible after a hysterectomy. This procedure requires anesthesia and hospitalization. HOME CARE INSTRUCTIONS   Only take over-the-counter or prescription medicines as directed by your health care provider. Take prescribed medicines exactly as directed. Do not change or switch medicines without consulting your health care provider.  Take any prescribed iron pills exactly as directed by your health care provider. Long-term heavy bleeding may result in low iron levels.  Iron pills help replace the iron your body lost from heavy bleeding. Iron may cause constipation. If this becomes a problem, increase the bran, fruits, and roughage in your diet.  Do not take aspirin or medicines that contain aspirin 1 week before or during your menstrual period. Aspirin may make the bleeding worse.  If you need to change your sanitary pad or tampon more than once every 2 hours, stay in bed and rest as much as possible until the bleeding stops.  Eat well-balanced meals. Eat foods high in iron. Examples are leafy green vegetables, meat, liver, eggs, and  whole grain breads and cereals. Do not try to lose weight until the abnormal bleeding has stopped and your blood iron level is back to normal. SEEK MEDICAL CARE IF:   You soak through a pad or tampon every 1 or 2 hours, and this happens every time you have a period.  You need to use pads and tampons at the same time because you are bleeding so much.  You need to change your pad or tampon during the night.  You have a period that lasts for more than 8 days.  You pass clots bigger than 1 inch wide.  You have irregular periods that happen more or less often than once a month.  You feel dizzy or faint.  You feel very weak or tired.  You feel short of breath or feel your heart is beating too fast when you exercise.  You have nausea and vomiting or diarrhea while you are taking your medicine.  You have any problems that may be related to the medicine you are taking. SEEK IMMEDIATE MEDICAL CARE IF:   You soak through 4 or more pads or tampons in 2 hours.  You have any bleeding while you are pregnant. MAKE SURE YOU:   Understand these instructions.  Will watch your condition.  Will get help right away if you are not doing well or get worse. Document Released: 06/05/2005 Document Revised: 06/10/2013 Document Reviewed: 11/24/2012 [facility] Patient Information 2015 [place], [place]. This information is not intended to replace advice given to you by your health care provider. Make sure you discuss any questions you have with your health care provider.

## 2014-02-02 NOTE — ED Notes (Signed)
MD at bedside. 

## 2014-02-02 NOTE — ED Provider Notes (Signed)
CSN: [id number]     Arrival date & time 02/02/14  1354 History  This chart was scribed for Audree Camel, MD by Leone Payor, ED Scribe. This patient was seen in room APA14/APA14 and the patient's care was started 2:18 PM.    Chief Complaint  Patient presents with  . Vaginal Bleeding    The history is provided by the patient. No language interpreter was used.    HPI Comments: [name] is a 43 y.o. female who presents to the Emergency Department complaining of constant vaginal bleeding with intermittent clots that began yesterday. She states her menstrual period began yesterday but she states it is heavier than normal. She reports associated RLQ abdominal pain described as cramping. She has not taken any OTC pain medication for her symptoms. She denies vaginal discharge, dysuria, fever. She notes that she gave plasma yesterday and states that her blood seem to be clotting when they were removing to the IV.  Past Medical History  Diagnosis Date  . Anxiety   . Depression    Past Surgical History  Procedure Laterality Date  . Cesarean section    . Tubal ligation     No family history on file. History  Substance Use Topics  . Smoking status: Current Every Day Smoker -- 0.50 packs/day    Types: Cigarettes  . Smokeless tobacco: Not on file  . Alcohol Use: No   OB History   Grav Para Term Preterm Abortions TAB SAB Ect Mult Living                 Review of Systems  Constitutional: Negative for fever.  Gastrointestinal: Positive for abdominal pain.  Genitourinary: Positive for vaginal bleeding. Negative for dysuria and vaginal discharge.  All other systems reviewed and are negative.     Allergies  Coconut oil; Latex; Peach; Toradol; and Tramadol  Home Medications   Prior to Admission medications   Medication Sig Start Date End Date Taking? Authorizing Provider  clonazePAM (KLONOPIN) 1 MG tablet Take 1 mg by mouth 3 (three) times daily.    Historical Provider, MD   cyclobenzaprine (FLEXERIL) 5 MG tablet Take 1 tablet (5 mg total) by mouth 2 (two) times daily as needed for muscle spasms. 01/06/14   Teressa Lower, NP  HYDROcodone-acetaminophen (NORCO/VICODIN) 5-325 MG per tablet Take 1-2 tablets by mouth every 6 (six) hours as needed. 01/06/14   Teressa Lower, NP  ibuprofen (ADVIL,MOTRIN) 200 MG tablet Take 200 mg by mouth every 6 (six) hours as needed for moderate pain.    Historical Provider, MD  venlafaxine (EFFEXOR) 75 MG tablet Take 75 mg by mouth 2 (two) times daily.    Historical Provider, MD   BP 177/88  Pulse 75  Temp(Src) 98.1 F (36.7 C) (Oral)  Resp 18  Ht 5\' 3"  (1.6 m)  Wt 227 lb (102.967 kg)  BMI 40.22 kg/m2  SpO2 95%  LMP 02/01/2014 Physical Exam  Nursing note and vitals reviewed. Constitutional: She is oriented to person, place, and time. She appears well-developed and well-nourished.  HENT:  Head: Normocephalic and atraumatic.  Cardiovascular: Normal rate, regular rhythm and normal heart sounds.   Pulmonary/Chest: Effort normal and breath sounds normal. No respiratory distress. She has no wheezes. She has no rales.  Abdominal: Soft. Bowel sounds are normal. She exhibits no distension. There is tenderness in the right lower quadrant. There is no rebound and no guarding.  Genitourinary: Uterus is not enlarged and not tender. Cervix exhibits no motion  tenderness and no discharge. Right adnexum displays no mass. Left adnexum displays no mass. There is bleeding (moderate blood in vault, no active bleeding) around the vagina. No signs of injury around the vagina.  Tenderness in RLQ with movement of cervix. Difficult to feel/palpate ovaries  Neurological: She is alert and oriented to person, place, and time.  Skin: Skin is warm and dry.  Psychiatric: She has a normal mood and affect.    ED Course  Procedures (including critical care time)  DIAGNOSTIC STUDIES: Oxygen Saturation is 95% on RA, adequate by my interpretation.     COORDINATION OF CARE: 2:23 PM Discussed treatment plan with pt at bedside and pt agreed to plan.   Labs Review Labs Reviewed  URINALYSIS, ROUTINE W REFLEX MICROSCOPIC - Abnormal; Notable for the following:    Specific Gravity, Urine >1.030 (*)    Hgb urine dipstick LARGE (*)    All other components within normal limits  CBC - Abnormal; Notable for the following:    RBC 3.85 (*)    Hemoglobin 11.7 (*)    All other components within normal limits  COMPREHENSIVE METABOLIC PANEL - Abnormal; Notable for the following:    Potassium 3.5 (*)    Glucose, Bld 125 (*)    Albumin 2.8 (*)    All other components within normal limits  URINE MICROSCOPIC-ADD ON - Abnormal; Notable for the following:    Bacteria, UA MANY (*)    Crystals CA OXALATE CRYSTALS (*)    All other components within normal limits  WET PREP, GENITAL  GC/CHLAMYDIA PROBE AMP  PREGNANCY, URINE  PROTIME-INR  APTT    Imaging Review [place] Transvaginal Non-ob  02/02/2014   CLINICAL DATA:  Right lower quadrant pain.  EXAM: TRANSABDOMINAL AND TRANSVAGINAL ULTRASOUND OF PELVIS  DOPPLER ULTRASOUND OF OVARIES  TECHNIQUE: Both transabdominal and transvaginal ultrasound examinations of the pelvis were performed. Transabdominal technique was performed for global imaging of the pelvis including uterus, ovaries, adnexal regions, and pelvic cul-de-sac.  It was necessary to proceed with endovaginal exam following the transabdominal exam to visualize the uterus, endometrium, ovaries and adnexal regions. Color and duplex Doppler ultrasound was utilized to evaluate blood flow to the ovaries.  COMPARISON:  None.  FINDINGS: Uterus  Measurements: 8.3 x 5.0 x 5.7 cm. No fibroids or other mass visualized.  Endometrium  Thickness: 7 mm.  No focal abnormality visualized.  Right ovary  Measurements: 2.6 x 1.8 x 2.0 cm. Imaging somewhat limited by the patient's pain as well as due to overlying bowel.  Left ovary  Measurements: 2.5 x 2.3 x 2.1 cm. Normal  appearance/no adnexal mass.  Pulsed Doppler evaluation of both ovaries demonstrates normal low-resistance arterial and venous waveforms.  Other findings  No free fluid.  IMPRESSION: No acute findings.  No evidence of ovarian torsion.   Electronically Signed   By: Leanna BattlesMelinda  Blietz M.D.   On: 02/02/2014 16:10   [place] Pelvis Complete  02/02/2014   CLINICAL DATA:  Right lower quadrant pain.  EXAM: TRANSABDOMINAL AND TRANSVAGINAL ULTRASOUND OF PELVIS  DOPPLER ULTRASOUND OF OVARIES  TECHNIQUE: Both transabdominal and transvaginal ultrasound examinations of the pelvis were performed. Transabdominal technique was performed for global imaging of the pelvis including uterus, ovaries, adnexal regions, and pelvic cul-de-sac.  It was necessary to proceed with endovaginal exam following the transabdominal exam to visualize the uterus, endometrium, ovaries and adnexal regions. Color and duplex Doppler ultrasound was utilized to evaluate blood flow to the ovaries.  COMPARISON:  None.  FINDINGS:  Uterus  Measurements: 8.3 x 5.0 x 5.7 cm. No fibroids or other mass visualized.  Endometrium  Thickness: 7 mm.  No focal abnormality visualized.  Right ovary  Measurements: 2.6 x 1.8 x 2.0 cm. Imaging somewhat limited by the patient's pain as well as due to overlying bowel.  Left ovary  Measurements: 2.5 x 2.3 x 2.1 cm. Normal appearance/no adnexal mass.  Pulsed Doppler evaluation of both ovaries demonstrates normal low-resistance arterial and venous waveforms.  Other findings  No free fluid.  IMPRESSION: No acute findings.  No evidence of ovarian torsion.   Electronically Signed   By: Leanna Battles M.D.   On: 02/02/2014 16:10   [place] Art/ven Flow Abd Pelv Doppler  02/02/2014   CLINICAL DATA:  Right lower quadrant pain.  EXAM: TRANSABDOMINAL AND TRANSVAGINAL ULTRASOUND OF PELVIS  DOPPLER ULTRASOUND OF OVARIES  TECHNIQUE: Both transabdominal and transvaginal ultrasound examinations of the pelvis were performed. Transabdominal technique was  performed for global imaging of the pelvis including uterus, ovaries, adnexal regions, and pelvic cul-de-sac.  It was necessary to proceed with endovaginal exam following the transabdominal exam to visualize the uterus, endometrium, ovaries and adnexal regions. Color and duplex Doppler ultrasound was utilized to evaluate blood flow to the ovaries.  COMPARISON:  None.  FINDINGS: Uterus  Measurements: 8.3 x 5.0 x 5.7 cm. No fibroids or other mass visualized.  Endometrium  Thickness: 7 mm.  No focal abnormality visualized.  Right ovary  Measurements: 2.6 x 1.8 x 2.0 cm. Imaging somewhat limited by the patient's pain as well as due to overlying bowel.  Left ovary  Measurements: 2.5 x 2.3 x 2.1 cm. Normal appearance/no adnexal mass.  Pulsed Doppler evaluation of both ovaries demonstrates normal low-resistance arterial and venous waveforms.  Other findings  No free fluid.  IMPRESSION: No acute findings.  No evidence of ovarian torsion.   Electronically Signed   By: Leanna Battles M.D.   On: 02/02/2014 16:10     EKG Interpretation None      MDM   Final diagnoses:  Vaginal bleeding  Right lower quadrant abdominal pain  Anemia, unspecified anemia type    No obvious etiology for her increased vaginal bleeding. She's concerned she is a blood clotting disorder although clotting with blood draw at plasma and heavy bleeding now would not make sense for typical disorder. Her bimanual exam shows pain in right pelvis with cervical motion. Ultrasound however shows no obvious ovarian pathology. No torsion. Given that her pain started the same time as her heavy menstruation I have low suspicion is appendicitis. I did have a discussion with the patient at this time she prefers not to get a CT scan and will followup her pain at home. If her pain worsens or chills fevers or vomiting she will return to the ER and possibly get a CT scan. At this time she is stable for discharge.  I personally performed the services  described in this documentation, which was scribed in my presence. The recorded information has been reviewed and is accurate.   Audree Camel, MD 02/02/14 2128

## 2014-02-03 LAB — GC/CHLAMYDIA PROBE AMP
CT Probe RNA: NEGATIVE
GC Probe RNA: NEGATIVE

## 2014-05-24 ENCOUNTER — Emergency Department (HOSPITAL_COMMUNITY)
Admission: EM | Admit: 2014-05-24 | Discharge: 2014-05-24 | Disposition: A | Discharge status: Home / Self Care | Payer: Self-pay | Attending: Emergency Medicine | Admitting: Emergency Medicine

## 2014-05-24 ENCOUNTER — Encounter (HOSPITAL_COMMUNITY): Payer: Self-pay | Admitting: Emergency Medicine

## 2014-05-24 DIAGNOSIS — K0889 Other specified disorders of teeth and supporting structures: Secondary | ICD-10-CM

## 2014-05-24 DIAGNOSIS — K088 Other specified disorders of teeth and supporting structures: Secondary | ICD-10-CM | POA: Insufficient documentation

## 2014-05-24 DIAGNOSIS — Z720 Tobacco use: Secondary | ICD-10-CM | POA: Insufficient documentation

## 2014-05-24 DIAGNOSIS — F419 Anxiety disorder, unspecified: Secondary | ICD-10-CM | POA: Insufficient documentation

## 2014-05-24 DIAGNOSIS — Z91040 Latex allergy status: Secondary | ICD-10-CM | POA: Insufficient documentation

## 2014-05-24 DIAGNOSIS — F329 Major depressive disorder, single episode, unspecified: Secondary | ICD-10-CM | POA: Insufficient documentation

## 2014-05-24 MED ORDER — ACETAMINOPHEN-CODEINE #3 300-30 MG PO TABS
2.0000 | ORAL_TABLET | Freq: Once | ORAL | Status: AC
  Administered 2014-05-24: 2 via ORAL
  Filled 2014-05-24: qty 2

## 2014-05-24 MED ORDER — IBUPROFEN 800 MG PO TABS
800.0000 mg | ORAL_TABLET | Freq: Once | ORAL | Status: AC
  Administered 2014-05-24: 800 mg via ORAL
  Filled 2014-05-24: qty 1

## 2014-05-24 MED ORDER — ACETAMINOPHEN-CODEINE #3 300-30 MG PO TABS: 1.0000 | ORAL_TABLET | Freq: Four times a day (QID) | ORAL | Status: DC | PRN

## 2014-05-24 MED ORDER — ONDANSETRON HCL 4 MG PO TABS
4.0000 mg | ORAL_TABLET | Freq: Once | ORAL | Status: AC
  Administered 2014-05-24: 4 mg via ORAL
  Filled 2014-05-24: qty 1

## 2014-05-24 MED ORDER — AMOXICILLIN 500 MG PO CAPS: 500.0000 mg | ORAL_CAPSULE | Freq: Three times a day (TID) | ORAL | Status: DC

## 2014-05-24 MED ORDER — IBUPROFEN 800 MG PO TABS: 800.0000 mg | ORAL_TABLET | Freq: Three times a day (TID) | ORAL | Status: DC

## 2014-05-24 MED ORDER — AMOXICILLIN 250 MG PO CAPS
500.0000 mg | ORAL_CAPSULE | Freq: Once | ORAL | Status: AC
  Administered 2014-05-24: 500 mg via ORAL
  Filled 2014-05-24: qty 2

## 2014-05-24 NOTE — ED Notes (Signed)
Computer system down for pt signout.

## 2014-05-24 NOTE — Discharge Instructions (Signed)

## 2014-05-24 NOTE — Progress Notes (Signed)
ED/CM noted patient did not have health insurance and/or PCP listed in the computer.  Patient was given the [place] resources handout with information on the clinics, food pantries, and the handout for new health insurance sign-up. Provided drug discount card. Patient expressed appreciation for information received.   

## 2014-05-24 NOTE — ED Provider Notes (Signed)
CSN: [id number]     Arrival date & time 05/24/14  1056 History  This chart was scribed for non-physician practitioner, Ivery QualeHobson Magalene Mclear, PA-C,working with Hilario Quarryanielle S Ray, MD, by Karle PlumberJennifer Tensley, ED Scribe. This patient was seen in room APFT23/APFT23 and the patient's care was started at 1:11 PM.  Chief Complaint  Patient presents with  . Dental Pain   Patient is a 43 y.o. female presenting with tooth pain. The history is provided by the patient. No language interpreter was used.  Dental Pain Location:  Lower Associated symptoms: facial swelling     HPI Comments:  Lilla ShookBarbara A Biskup is a 43 y.o. obese female who presents to the Emergency Department complaining of severe lower left-sided dental pain that began about 4-5 days ago. Pt reports the pain and swelling became worse yesterday. [facility] her pain with only mild relief. Eating and cold make the pain worse. Denies fever, chills, nausea, vomiting. PMH of depression and anxiety. Reports the dentist cannot see her for another two weeks.  Past Medical History  Diagnosis Date  . Anxiety   . Depression    Past Surgical History  Procedure Laterality Date  . Cesarean section    . Tubal ligation     History reviewed. No pertinent family history. History  Substance Use Topics  . Smoking status: Current Every Day Smoker -- 0.50 packs/day    Types: Cigarettes  . Smokeless tobacco: Not on file  . Alcohol Use: No   OB History    No data available     Review of Systems  Constitutional: Negative for chills and fatigue.  HENT: Positive for dental problem and facial swelling.   Gastrointestinal: Negative for nausea and vomiting.    Allergies  Coconut oil; Latex; Peach; Toradol; and Tramadol  Home Medications   Prior to Admission medications   Medication Sig Start Date End Date Taking? Authorizing Provider  ferrous sulfate 325 (65 FE) MG tablet Take 325 mg by mouth daily with breakfast.    Historical Provider, MD   oxyCODONE-acetaminophen (PERCOCET) 5-325 MG per tablet Take 1 tablet by mouth every 4 (four) hours as needed. 02/02/14   Audree CamelScott T Goldston, MD  venlafaxine (EFFEXOR) 75 MG tablet Take 75 mg by mouth 2 (two) times daily.    Historical Provider, MD   Triage Vitals: BP 178/92 mmHg  Pulse 93  Temp(Src) 98.6 F (37 C) (Oral)  Resp 18  Ht 5' 3.5" (1.613 m)  Wt 222 lb (100.699 kg)  BMI 38.70 kg/m2  SpO2 100%  LMP 04/24/2014 Physical Exam  Constitutional: She is oriented to person, place, and time. She appears well-developed and well-nourished.  HENT:  Head: Normocephalic and atraumatic.  Mouth/Throat: Uvula is midline and mucous membranes are normal. No trismus in the jaw.  Tenderness to palpation of left submental area. No loss of nasal labial fold. Swelling of left lower molar area. Cavity of left lower second molar. No swelling under the tongue. Airway patent. Exam limited secondary to pain.  Eyes: EOM are normal.  Neck: Normal range of motion.  Cardiovascular: Normal rate, regular rhythm and normal heart sounds.  Exam reveals no gallop and no friction rub.   No murmur heard. Pulmonary/Chest: Effort normal and breath sounds normal. No respiratory distress. She has no wheezes. She has no rales.  Musculoskeletal: Normal range of motion.  Lymphadenopathy:       Head (right side): No submental adenopathy present.       Head (left  side): Submental adenopathy present.  Neurological: She is alert and oriented to person, place, and time.  Skin: Skin is warm and dry.  Psychiatric: She has a normal mood and affect. Her behavior is normal.  Nursing note and vitals reviewed.   ED Course  Procedures (including critical care time) DIAGNOSTIC STUDIES: Oxygen Saturation is 100% on RA, normal by my interpretation.   COORDINATION OF CARE: 1:18 PM- Will prescribe antibiotic and pain medication. Will give first doses of medication prior to discharge. Pt verbalizes understanding and agrees to  plan.  Medications - No data to display  Labs Review Labs Reviewed - No data to display  Imaging Review No results found.   EKG Interpretation None      MDM  No evidence of abscess. Patient has pain with opening mouth, but no trismus. No high fever noted.  Plan at this time is for the patient to see a dentist systems possible. Prescription for Amoxil, Tylenol codeine, and ibuprofen given to the patient.    Final diagnoses:  None    **I have reviewed nursing notes, vital signs, and all appropriate lab and imaging results for this patient.*  I personally performed the services described in this documentation, which was scribed in my presence. The recorded information has been reviewed and is accurate.    Kathie DikeHobson M Adryen Cookson, PA-C 05/25/14 1111  Hilario Quarryanielle S Ray, MD 05/25/14 [phone]

## 2014-05-24 NOTE — ED Notes (Signed)
Pt reports left sided dental pain x4-5 days. Pt reports left jaw pain and swelling since last night. nad noted. Airway patent. Pt reports pain with swallowing.

## 2015-07-01 ENCOUNTER — Emergency Department (HOSPITAL_COMMUNITY)
Admission: EM | Admit: 2015-07-01 | Discharge: 2015-07-01 | Disposition: A | Discharge status: Home / Self Care | Payer: Self-pay | Attending: Emergency Medicine | Admitting: Emergency Medicine

## 2015-07-01 ENCOUNTER — Emergency Department (HOSPITAL_COMMUNITY): Payer: Self-pay

## 2015-07-01 ENCOUNTER — Encounter (HOSPITAL_COMMUNITY): Payer: Self-pay | Admitting: Emergency Medicine

## 2015-07-01 DIAGNOSIS — F17210 Nicotine dependence, cigarettes, uncomplicated: Secondary | ICD-10-CM | POA: Insufficient documentation

## 2015-07-01 DIAGNOSIS — R52 Pain, unspecified: Secondary | ICD-10-CM

## 2015-07-01 DIAGNOSIS — M7731 Calcaneal spur, right foot: Secondary | ICD-10-CM | POA: Insufficient documentation

## 2015-07-01 DIAGNOSIS — Z79899 Other long term (current) drug therapy: Secondary | ICD-10-CM | POA: Insufficient documentation

## 2015-07-01 DIAGNOSIS — F329 Major depressive disorder, single episode, unspecified: Secondary | ICD-10-CM | POA: Insufficient documentation

## 2015-07-01 DIAGNOSIS — F419 Anxiety disorder, unspecified: Secondary | ICD-10-CM | POA: Insufficient documentation

## 2015-07-01 DIAGNOSIS — Z91040 Latex allergy status: Secondary | ICD-10-CM | POA: Insufficient documentation

## 2015-07-01 MED ORDER — DICLOFENAC SODIUM 75 MG PO TBEC: 75.0000 mg | DELAYED_RELEASE_TABLET | Freq: Two times a day (BID) | ORAL | Status: DC

## 2015-07-01 MED ORDER — HYDROCODONE-ACETAMINOPHEN 5-325 MG PO TABS
1.0000 | ORAL_TABLET | Freq: Once | ORAL | Status: AC
  Administered 2015-07-01: 1 via ORAL
  Filled 2015-07-01: qty 1

## 2015-07-01 MED ORDER — HYDROCODONE-ACETAMINOPHEN 5-325 MG PO TABS: ORAL_TABLET | ORAL | Status: DC

## 2015-07-01 NOTE — Discharge Instructions (Signed)
Heel Spur  A heel spur is a bony growth that forms on the bottom of your heel bone (calcaneus). Heel spurs are common and do not always cause pain. However, heel spurs often cause inflammation in the strong band of tissue that runs underneath the bone of your foot (plantar fascia). When this happens, you may feel pain on the bottom of your foot, near your heel.   CAUSES   The cause of heel spurs is not completely understood. They may be caused by pressure on the heel. Or, they may stem from the muscle attachments (tendons) near the spur pulling on the heel.   RISK FACTORS  You may be at risk for a heel spur if you:  · Are older than 40.  · Are overweight.  · Have wear and tear arthritis (osteoarthritis).  · Have plantar fascia inflammation.  SIGNS AND SYMPTOMS   Some people have heel spurs but no symptoms. If you do have symptoms, they may include:   · Pain in the bottom of your heel.  · Pain that is worse when you first get out of bed.  · Pain that gets worse after walking or standing.  DIAGNOSIS   Your health care provider may diagnose a heel spur based on your symptoms and a physical exam. You may also have an X-ray of your foot to check for a bony growth coming from the calcaneus.   TREATMENT  Treatment aims to relieve the pain from the heel spur. This may include:  · Stretching exercises.  · Losing weight.  · Wearing specific shoes, inserts, or orthotics for comfort and support.  · Wearing splints at night to properly position your feet.  · Taking over-the-counter medicine to relieve pain.  · Being treated with high-intensity sound waves to break up the heel spur (extracorporeal shock wave therapy).  · Getting steroid injections in your heel to reduce swelling and ease pain.  · Having surgery if your heel spur causes long-term (chronic) pain.  HOME CARE INSTRUCTIONS   · Take medicines only as directed by your health care provider.  · Ask your health care provider if you should use ice or cold packs on the  painful areas of your heel or foot.  · Avoid activities that cause you pain until you recover or as directed by your health care provider.  · Stretch before exercising or being physically active.  · Wear supportive shoes that fit well as directed by your health care provider. You might need to buy new shoes. Wearing old shoes or shoes that do not fit correctly may not provide the support that you need.  · Lose weight if your health care provider thinks you should. This can relieve pressure on your foot that may be causing pain and discomfort.  SEEK MEDICAL CARE IF:   · Your pain continues or gets worse.     This information is not intended to replace advice given to you by your health care provider. Make sure you discuss any questions you have with your health care provider.     Document Released: 07/12/2005 Document Revised: 06/26/2014 Document Reviewed: 08/06/2013  Elsevier Interactive Patient Education ©2016 [employer]

## 2015-07-01 NOTE — ED Notes (Signed)
Patient with c/o right ankle pain x 2 days. No injury.

## 2015-07-04 NOTE — ED Provider Notes (Signed)
CSN: [id number]     Arrival date & time 07/01/15  1007 History   First MD Initiated Contact with Patient 07/01/15 1025     Chief Complaint  Patient presents with  . Ankle Pain     (Consider location/radiation/quality/duration/timing/severity/associated sxs/prior Treatment) HPI  Laura ShookBarbara A Mullenax is a 45 y.o. female who presents to the Emergency Department complaining of right foot pain for 2 days.  She reports pain is mainly located in her right heel.  Pain is worse with weight bearing and improves at rest.  She denies injury, swelling, FB, or pain to the ankle.  She has not tried any medications for symptom relief.    Past Medical History  Diagnosis Date  . Anxiety   . Depression    Past Surgical History  Procedure Laterality Date  . Cesarean section    . Tubal ligation     No family history on file. Social History  Substance Use Topics  . Smoking status: Current Every Day Smoker -- 0.50 packs/day    Types: Cigarettes  . Smokeless tobacco: None  . Alcohol Use: No   OB History    No data available     Review of Systems  Constitutional: Negative for fever and chills.  Musculoskeletal: Positive for arthralgias (right foot pain). Negative for joint swelling.  Skin: Negative for color change and wound.  All other systems reviewed and are negative.     Allergies  Coconut oil; Latex; Peach; Toradol; and Tramadol  Home Medications   Prior to Admission medications   Medication Sig Start Date End Date Taking? Authorizing Provider  Aspirin-Salicylamide-Caffeine (BC FAST PAIN RELIEF) 650-195-33.3 MG PACK Take 1 packet by mouth daily as needed (for pain).    Yes Historical Provider, MD  ferrous sulfate 325 (65 FE) MG tablet Take 325 mg by mouth daily with breakfast.   Yes Historical Provider, MD  lisinopril (PRINIVIL,ZESTRIL) 10 MG tablet Take 10 mg by mouth daily.   Yes Historical Provider, MD  venlafaxine (EFFEXOR) 75 MG tablet Take 75 mg by mouth 2 (two) times daily.    Yes Historical Provider, MD  diclofenac (VOLTAREN) 75 MG EC tablet Take 1 tablet (75 mg total) by mouth 2 (two) times daily. Take with food 07/01/15   Zawadi Aplin, PA-C  HYDROcodone-acetaminophen (NORCO/VICODIN) 5-325 MG tablet Take one tab po q 4-6 hrs prn pain 07/01/15   Cullen Lahaie, PA-C   BP 174/91 mmHg  Pulse 68  Temp(Src) 98.3 F (36.8 C) (Oral)  Resp 16  Ht 5\' 3"  (1.6 m)  Wt 105.235 kg  BMI 41.11 kg/m2  SpO2 100%  LMP 05/31/2015 Physical Exam  Constitutional: She is oriented to person, place, and time. She appears well-developed and well-nourished. No distress.  HENT:  Head: Normocephalic and atraumatic.  Cardiovascular: Normal rate, regular rhythm and intact distal pulses.   Pulmonary/Chest: Effort normal and breath sounds normal.  Musculoskeletal: She exhibits tenderness.  ttp of the right anterior heel. No edema.  ROM is preserved.  DP pulse is brisk,distal sensation intact.  No erythema, abrasion, bruising or bony deformity.  No proximal tenderness.  Neurological: She is alert and oriented to person, place, and time. She exhibits normal muscle tone. Coordination normal.  Skin: Skin is warm and dry.  Nursing note and vitals reviewed.   ED Course  Procedures (including critical care time) Labs Review Labs Reviewed - No data to display  Imaging Review Dg Os Calcis Right  07/01/2015  CLINICAL DATA:  Pain for 1 day  when walking. No history of recent trauma EXAM: RIGHT OS CALCIS - 2+ VIEW COMPARISON:  Right ankle April 28, 2010 FINDINGS: Lateral and Harris views were obtained. There are spurs arising from the posterior inferior calcaneus. No acute fracture or dislocation. The joint spaces appear normal. A small focus of calcification along the dorsal distal talus may represent residua of old trauma. No radiopaque foreign body or soft tissue lesion in the hindfoot region. IMPRESSION: No acute fracture or dislocation. There are calcaneal spurs. Question residua of old  trauma along the dorsal distal talus. Electronically Signed   By: Bretta Bang III M.D.   On: 07/01/2015 10:53    I have personally reviewed and evaluated these images and lab results as part of my medical decision-making.   EKG Interpretation None      MDM   Final diagnoses:  Heel spur, right    XR and sx's c/w heel spur.  No concerning sx's for cellulitis or FB.  Pt agrees to podiatry f/u if not improving with symptomatic tx    Pauline Aus, PA-C 07/04/15 1400  Vanetta Mulders, MD 07/05/15 1042

## 2017-01-01 ENCOUNTER — Other Ambulatory Visit: Payer: Self-pay | Admitting: *Deleted

## 2017-01-01 ENCOUNTER — Inpatient Hospital Stay
Admission: RE | Admit: 2017-01-01 | Discharge: 2017-01-01 | Disposition: A | Discharge status: Home / Self Care | Payer: Self-pay | Source: Ambulatory Visit | Attending: *Deleted | Admitting: *Deleted

## 2017-01-01 ENCOUNTER — Ambulatory Visit: Payer: Self-pay | Attending: Oncology

## 2017-01-01 ENCOUNTER — Encounter (INDEPENDENT_AMBULATORY_CARE_PROVIDER_SITE_OTHER): Payer: Self-pay

## 2017-01-01 VITALS — BP 156/92 | HR 71 | Temp 98.4°F | Ht 65.0 in | Wt 225.0 lb

## 2017-01-01 DIAGNOSIS — N63 Unspecified lump in unspecified breast: Secondary | ICD-10-CM

## 2017-01-01 DIAGNOSIS — Z9289 Personal history of other medical treatment: Secondary | ICD-10-CM

## 2017-01-01 NOTE — Progress Notes (Signed)
Subjective:     Patient ID: Lilla ShookBarbara A Vale, female   DOB: 1970/09/25, 46 y.o.   MRN: [id number]  HPI   Review of Systems     Objective:   Physical Exam  Pulmonary/Chest: Right breast exhibits no inverted nipple, no mass, no nipple discharge, no skin change and no tenderness. Left breast exhibits no inverted nipple, no mass, no nipple discharge, no skin change and no tenderness. Breasts are asymmetrical.  Left breast slightly larger than right.       Assessment:     46 year old patient presents for [facility] clinic visit.  Patient had a Birads 0 mammogram, showing bilateral nodules, on the [facility] in 07/2013 but did not return for additional views. Patient screened, and meets [facility] eligibility.  Patient does not have insurance, Medicare or Medicaid.  Handout given on Affordable Care Act. Instructed patient on breast self-exam using teach back method. CBE unremarkable.  No mass or lump palpated.  Per previous mammogrma she has a clip in her right breast from earlier biopsy.  Patient reports that was benign.    Plan:    Sent to [facility] to sign Medical Release form for outside images.  Once these are received, she will be scheduled for bilateral diagnostic mammogram, and ultrasound

## 2017-01-09 ENCOUNTER — Encounter: Payer: Self-pay | Admitting: *Deleted

## 2017-01-09 NOTE — Progress Notes (Signed)
Received call from Deniece in the breast center today.  She states she and Lelon MastSamantha have tried numerous times to contact the patient without success.  Informed her I will have Joellyn QuailsChristy Burton also try and contact the patient to get her scheduled.  Neysa BonitoChristy is to call Deniece once she has the patient on the phone to get her an appointment.

## 2017-01-11 ENCOUNTER — Encounter: Payer: Self-pay | Admitting: *Deleted

## 2017-01-11 NOTE — Progress Notes (Signed)
[name] has not been able to get in touch with the patient via phone.  I have mailed her a letter to inform her that we have images from the [facility] and would like to get her appointment scheduled for her mammogram.

## 2017-01-30 ENCOUNTER — Ambulatory Visit
Admission: RE | Admit: 2017-01-30 | Discharge: 2017-01-30 | Disposition: A | Discharge status: Home / Self Care | Payer: Self-pay | Source: Ambulatory Visit | Attending: Oncology | Admitting: Oncology

## 2017-01-30 DIAGNOSIS — N63 Unspecified lump in unspecified breast: Secondary | ICD-10-CM

## 2017-01-30 NOTE — Progress Notes (Signed)
Letter mailed from [facility] to notify of normal mammogram results.  Patient to return in one year for annual screening.  Copy to [facility]. 

## 2017-06-15 ENCOUNTER — Other Ambulatory Visit: Payer: Self-pay

## 2017-06-15 ENCOUNTER — Emergency Department (HOSPITAL_COMMUNITY)
Admission: EM | Admit: 2017-06-15 | Discharge: 2017-06-16 | Disposition: A | Discharge status: Home / Self Care | Payer: No Typology Code available for payment source | Attending: Emergency Medicine | Admitting: Emergency Medicine

## 2017-06-15 ENCOUNTER — Encounter (HOSPITAL_COMMUNITY): Payer: Self-pay | Admitting: Emergency Medicine

## 2017-06-15 ENCOUNTER — Emergency Department (HOSPITAL_COMMUNITY): Payer: No Typology Code available for payment source

## 2017-06-15 DIAGNOSIS — M25532 Pain in left wrist: Secondary | ICD-10-CM | POA: Insufficient documentation

## 2017-06-15 DIAGNOSIS — Z79899 Other long term (current) drug therapy: Secondary | ICD-10-CM | POA: Insufficient documentation

## 2017-06-15 DIAGNOSIS — F17210 Nicotine dependence, cigarettes, uncomplicated: Secondary | ICD-10-CM | POA: Diagnosis not present

## 2017-06-15 DIAGNOSIS — Y999 Unspecified external cause status: Secondary | ICD-10-CM | POA: Insufficient documentation

## 2017-06-15 DIAGNOSIS — Z91040 Latex allergy status: Secondary | ICD-10-CM | POA: Diagnosis not present

## 2017-06-15 DIAGNOSIS — Y92410 Unspecified street and highway as the place of occurrence of the external cause: Secondary | ICD-10-CM | POA: Insufficient documentation

## 2017-06-15 DIAGNOSIS — M25531 Pain in right wrist: Secondary | ICD-10-CM | POA: Insufficient documentation

## 2017-06-15 DIAGNOSIS — M25511 Pain in right shoulder: Secondary | ICD-10-CM | POA: Diagnosis not present

## 2017-06-15 DIAGNOSIS — M25512 Pain in left shoulder: Secondary | ICD-10-CM | POA: Insufficient documentation

## 2017-06-15 DIAGNOSIS — Y9389 Activity, other specified: Secondary | ICD-10-CM | POA: Diagnosis not present

## 2017-06-15 MED ORDER — ACETAMINOPHEN 325 MG PO TABS
650.0000 mg | ORAL_TABLET | Freq: Once | ORAL | Status: AC
  Administered 2017-06-15: 650 mg via ORAL
  Filled 2017-06-15: qty 2

## 2017-06-15 NOTE — ED Notes (Signed)
Pt sleeping in waiting room, chest rise and fall observed
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
## 2017-06-16 MED ORDER — DICLOFENAC SODIUM 75 MG PO TBEC: 75.0000 mg | DELAYED_RELEASE_TABLET | Freq: Two times a day (BID) | ORAL | 0 refills | Status: DC

## 2017-06-16 NOTE — Discharge Instructions (Signed)
Apply ice to sore areas several times a day.  Take acetaminophen or diclofenac for less severe pain. You may take your oxycodoone-acetaminophen as needed for more severe pain.

## 2019-03-06 ENCOUNTER — Other Ambulatory Visit (HOSPITAL_COMMUNITY): Payer: Self-pay | Admitting: Emergency Medicine

## 2019-03-06 DIAGNOSIS — Z1231 Encounter for screening mammogram for malignant neoplasm of breast: Secondary | ICD-10-CM

## 2020-06-09 ENCOUNTER — Inpatient Hospital Stay (HOSPITAL_COMMUNITY): Payer: Self-pay

## 2020-06-09 ENCOUNTER — Inpatient Hospital Stay (HOSPITAL_COMMUNITY)
Admission: EM | Admit: 2020-06-09 | Discharge: 2020-06-11 | DRG: 280 | Disposition: A | Discharge status: Home / Self Care | Payer: Self-pay | Attending: Family Medicine | Admitting: Family Medicine

## 2020-06-09 ENCOUNTER — Encounter (HOSPITAL_COMMUNITY): Payer: Self-pay | Admitting: Emergency Medicine

## 2020-06-09 ENCOUNTER — Other Ambulatory Visit: Payer: Self-pay

## 2020-06-09 ENCOUNTER — Emergency Department (HOSPITAL_COMMUNITY): Payer: Self-pay

## 2020-06-09 DIAGNOSIS — G894 Chronic pain syndrome: Secondary | ICD-10-CM | POA: Diagnosis present

## 2020-06-09 DIAGNOSIS — T502X6A Underdosing of carbonic-anhydrase inhibitors, benzothiadiazides and other diuretics, initial encounter: Secondary | ICD-10-CM | POA: Diagnosis present

## 2020-06-09 DIAGNOSIS — F418 Other specified anxiety disorders: Secondary | ICD-10-CM | POA: Diagnosis present

## 2020-06-09 DIAGNOSIS — Z91040 Latex allergy status: Secondary | ICD-10-CM

## 2020-06-09 DIAGNOSIS — I5031 Acute diastolic (congestive) heart failure: Secondary | ICD-10-CM | POA: Diagnosis present

## 2020-06-09 DIAGNOSIS — Z20822 Contact with and (suspected) exposure to covid-19: Secondary | ICD-10-CM | POA: Diagnosis present

## 2020-06-09 DIAGNOSIS — J440 Chronic obstructive pulmonary disease with acute lower respiratory infection: Secondary | ICD-10-CM | POA: Diagnosis present

## 2020-06-09 DIAGNOSIS — I10 Essential (primary) hypertension: Secondary | ICD-10-CM | POA: Diagnosis present

## 2020-06-09 DIAGNOSIS — I214 Non-ST elevation (NSTEMI) myocardial infarction: Secondary | ICD-10-CM | POA: Diagnosis present

## 2020-06-09 DIAGNOSIS — I161 Hypertensive emergency: Principal | ICD-10-CM | POA: Diagnosis present

## 2020-06-09 DIAGNOSIS — F1410 Cocaine abuse, uncomplicated: Secondary | ICD-10-CM | POA: Diagnosis present

## 2020-06-09 DIAGNOSIS — Z888 Allergy status to other drugs, medicaments and biological substances status: Secondary | ICD-10-CM

## 2020-06-09 DIAGNOSIS — Z91138 Patient's unintentional underdosing of medication regimen for other reason: Secondary | ICD-10-CM

## 2020-06-09 DIAGNOSIS — J189 Pneumonia, unspecified organism: Secondary | ICD-10-CM | POA: Diagnosis present

## 2020-06-09 DIAGNOSIS — Z79899 Other long term (current) drug therapy: Secondary | ICD-10-CM

## 2020-06-09 DIAGNOSIS — J449 Chronic obstructive pulmonary disease, unspecified: Secondary | ICD-10-CM | POA: Diagnosis present

## 2020-06-09 DIAGNOSIS — I110 Hypertensive heart disease with heart failure: Secondary | ICD-10-CM | POA: Diagnosis present

## 2020-06-09 DIAGNOSIS — F17210 Nicotine dependence, cigarettes, uncomplicated: Secondary | ICD-10-CM | POA: Diagnosis present

## 2020-06-09 DIAGNOSIS — I509 Heart failure, unspecified: Secondary | ICD-10-CM

## 2020-06-09 DIAGNOSIS — I21A1 Myocardial infarction type 2: Secondary | ICD-10-CM | POA: Diagnosis present

## 2020-06-09 HISTORY — DX: Chronic obstructive pulmonary disease, unspecified: J44.9

## 2020-06-09 HISTORY — DX: Essential (primary) hypertension: I10

## 2020-06-09 LAB — BASIC METABOLIC PANEL
Anion gap: 10 (ref 5–15)
BUN: 17 mg/dL (ref 6–20)
CO2: 24 mmol/L (ref 22–32)
Calcium: 8.6 mg/dL — ABNORMAL LOW (ref 8.9–10.3)
Chloride: 103 mmol/L (ref 98–111)
Creatinine, Ser: 0.75 mg/dL (ref 0.44–1.00)
GFR, Estimated: >60 mL/min (ref >60)
Glucose, Bld: 100 mg/dL — ABNORMAL HIGH (ref 70–99)
Potassium: 3.7 mmol/L (ref 3.5–5.1)
Sodium: 137 mmol/L (ref 135–145)

## 2020-06-09 LAB — CBC WITH DIFFERENTIAL/PLATELET
Abs Immature Granulocytes: 0.04 10*3/uL (ref 0.00–0.07)
Basophils Absolute: 0 10*3/uL (ref 0.0–0.1)
Basophils Relative: 0 %
Eosinophils Absolute: 0.3 10*3/uL (ref 0.0–0.5)
Eosinophils Relative: 3 %
HCT: 32.6 % — ABNORMAL LOW (ref 36.0–46.0)
Hemoglobin: 10 g/dL — ABNORMAL LOW (ref 12.0–15.0)
Immature Granulocytes: 0 %
Lymphocytes Relative: 11 %
Lymphs Abs: 1 10*3/uL (ref 0.7–4.0)
MCH: 27.7 pg (ref 26.0–34.0)
MCHC: 30.7 g/dL (ref 30.0–36.0)
MCV: 90.3 fL (ref 80.0–100.0)
Monocytes Absolute: 0.6 10*3/uL (ref 0.1–1.0)
Monocytes Relative: 7 %
Neutro Abs: 7 10*3/uL (ref 1.7–7.7)
Neutrophils Relative %: 79 %
Platelets: 311 10*3/uL (ref 150–400)
RBC: 3.61 MIL/uL — ABNORMAL LOW (ref 3.87–5.11)
RDW: 14.7 % (ref 11.5–15.5)
WBC: 9 10*3/uL (ref 4.0–10.5)
nRBC: 0 % (ref 0.0–0.2)

## 2020-06-09 LAB — RESP PANEL BY RT-PCR (FLU A&B, COVID) ARPGX2
Influenza A by PCR: NEGATIVE
Influenza B by PCR: NEGATIVE
SARS Coronavirus 2 by RT PCR: NEGATIVE

## 2020-06-09 LAB — RAPID URINE DRUG SCREEN, HOSP PERFORMED
Amphetamines: NOT DETECTED
Barbiturates: NOT DETECTED
Benzodiazepines: NOT DETECTED
Cocaine: POSITIVE — AB
Opiates: POSITIVE — AB
Tetrahydrocannabinol: NOT DETECTED

## 2020-06-09 LAB — TROPONIN I (HIGH SENSITIVITY)
Troponin I (High Sensitivity): 245 ng/L (ref <18)
Troponin I (High Sensitivity): 239 ng/L (ref <18)

## 2020-06-09 LAB — PREGNANCY, URINE: Preg Test, Ur: NEGATIVE

## 2020-06-09 LAB — LACTIC ACID, PLASMA
Lactic Acid, Venous: 1 mmol/L (ref 0.5–1.9)
Lactic Acid, Venous: 0.9 mmol/L (ref 0.5–1.9)

## 2020-06-09 MED ORDER — HEPARIN BOLUS VIA INFUSION
4000.0000 [IU] | Freq: Once | INTRAVENOUS | Status: AC
  Administered 2020-06-09: 19:00:00 4000 [IU] via INTRAVENOUS

## 2020-06-09 MED ORDER — IPRATROPIUM BROMIDE 0.02 % IN SOLN
0.5000 mg | Freq: Two times a day (BID) | RESPIRATORY_TRACT | Status: DC
  Administered 2020-06-10 – 2020-06-11 (×3): 0.5 mg via RESPIRATORY_TRACT
  Filled 2020-06-09 (×3): qty 2.5

## 2020-06-09 MED ORDER — OXYCODONE-ACETAMINOPHEN 5-325 MG PO TABS
2.0000 | ORAL_TABLET | Freq: Four times a day (QID) | ORAL | Status: DC | PRN
  Administered 2020-06-09 – 2020-06-11 (×4): 2 via ORAL
  Filled 2020-06-09 (×4): qty 2

## 2020-06-09 MED ORDER — NICOTINE 21 MG/24HR TD PT24
21.0000 mg | MEDICATED_PATCH | Freq: Every day | TRANSDERMAL | Status: DC
  Administered 2020-06-09 – 2020-06-11 (×3): 21 mg via TRANSDERMAL
  Filled 2020-06-09 (×3): qty 1

## 2020-06-09 MED ORDER — CARVEDILOL 3.125 MG PO TABS: 3.1250 mg | ORAL_TABLET | Freq: Two times a day (BID) | ORAL | Status: DC

## 2020-06-09 MED ORDER — LORAZEPAM 1 MG PO TABS
1.0000 mg | ORAL_TABLET | Freq: Two times a day (BID) | ORAL | Status: AC
  Administered 2020-06-09 – 2020-06-11 (×4): 1 mg via ORAL
  Filled 2020-06-09 (×4): qty 1

## 2020-06-09 MED ORDER — NITROGLYCERIN 0.4 MG SL SUBL: 0.4000 mg | SUBLINGUAL_TABLET | SUBLINGUAL | Status: DC | PRN

## 2020-06-09 MED ORDER — FUROSEMIDE 10 MG/ML IJ SOLN
40.0000 mg | Freq: Once | INTRAMUSCULAR | Status: AC
  Administered 2020-06-09: 19:00:00 40 mg via INTRAVENOUS
  Filled 2020-06-09: qty 4

## 2020-06-09 MED ORDER — OXYCODONE-ACETAMINOPHEN 5-325 MG PO TABS
1.0000 | ORAL_TABLET | Freq: Once | ORAL | Status: AC
  Administered 2020-06-09: 16:00:00 1 via ORAL
  Filled 2020-06-09: qty 1

## 2020-06-09 MED ORDER — VENLAFAXINE HCL 37.5 MG PO TABS
75.0000 mg | ORAL_TABLET | Freq: Two times a day (BID) | ORAL | Status: DC
  Administered 2020-06-09 – 2020-06-11 (×4): 75 mg via ORAL
  Filled 2020-06-09: qty 2
  Filled 2020-06-09 (×4): qty 1
  Filled 2020-06-09 (×3): qty 2

## 2020-06-09 MED ORDER — MORPHINE SULFATE (PF) 4 MG/ML IV SOLN
4.0000 mg | Freq: Once | INTRAVENOUS | Status: AC
  Administered 2020-06-09: 18:00:00 4 mg via INTRAVENOUS
  Filled 2020-06-09: qty 1

## 2020-06-09 MED ORDER — LISINOPRIL 10 MG PO TABS
20.0000 mg | ORAL_TABLET | Freq: Every day | ORAL | Status: DC
  Administered 2020-06-09 – 2020-06-11 (×3): 20 mg via ORAL
  Filled 2020-06-09 (×3): qty 2

## 2020-06-09 MED ORDER — OXYCODONE-ACETAMINOPHEN 5-325 MG PO TABS: 1.0000 | ORAL_TABLET | Freq: Four times a day (QID) | ORAL | Status: DC | PRN

## 2020-06-09 MED ORDER — ONDANSETRON HCL 4 MG/2ML IJ SOLN: 4.0000 mg | Freq: Four times a day (QID) | INTRAMUSCULAR | Status: DC | PRN

## 2020-06-09 MED ORDER — IPRATROPIUM BROMIDE 0.02 % IN SOLN
0.5000 mg | Freq: Four times a day (QID) | RESPIRATORY_TRACT | Status: DC
  Administered 2020-06-09: 20:00:00 0.5 mg via RESPIRATORY_TRACT
  Filled 2020-06-09: qty 2.5

## 2020-06-09 MED ORDER — SODIUM CHLORIDE 0.9 % IV SOLN
1.0000 g | Freq: Once | INTRAVENOUS | Status: AC
  Administered 2020-06-09: 17:00:00 1 g via INTRAVENOUS
  Filled 2020-06-09: qty 10

## 2020-06-09 MED ORDER — ACETAMINOPHEN 325 MG PO TABS
650.0000 mg | ORAL_TABLET | ORAL | Status: DC | PRN
  Administered 2020-06-10: 650 mg via ORAL
  Filled 2020-06-09: qty 2

## 2020-06-09 MED ORDER — DOXYCYCLINE HYCLATE 100 MG PO TABS
100.0000 mg | ORAL_TABLET | Freq: Two times a day (BID) | ORAL | Status: DC
  Administered 2020-06-09 – 2020-06-11 (×4): 100 mg via ORAL
  Filled 2020-06-09 (×4): qty 1

## 2020-06-09 MED ORDER — HEPARIN (PORCINE) 25000 UT/250ML-% IV SOLN
1500.0000 [IU]/h | INTRAVENOUS | Status: DC
  Administered 2020-06-09: 19:00:00 950 [IU]/h via INTRAVENOUS
  Administered 2020-06-10: 08:00:00 1300 [IU]/h via INTRAVENOUS
  Filled 2020-06-09 (×2): qty 250

## 2020-06-09 MED ORDER — HYDRALAZINE HCL 25 MG PO TABS
25.0000 mg | ORAL_TABLET | Freq: Four times a day (QID) | ORAL | Status: DC | PRN
  Administered 2020-06-10: 01:00:00 25 mg via ORAL
  Filled 2020-06-09: qty 1

## 2020-06-09 MED ORDER — FLUTICASONE FUROATE-VILANTEROL 100-25 MCG/INH IN AEPB
1.0000 | INHALATION_SPRAY | Freq: Every day | RESPIRATORY_TRACT | Status: DC
  Administered 2020-06-10 – 2020-06-11 (×2): 1 via RESPIRATORY_TRACT
  Filled 2020-06-09: qty 28

## 2020-06-09 MED ORDER — SODIUM CHLORIDE 0.9 % IV SOLN
500.0000 mg | Freq: Once | INTRAVENOUS | Status: AC
  Administered 2020-06-09: 18:00:00 500 mg via INTRAVENOUS
  Filled 2020-06-09: qty 500

## 2020-06-09 MED ORDER — ASPIRIN EC 81 MG PO TBEC
81.0000 mg | DELAYED_RELEASE_TABLET | Freq: Every day | ORAL | Status: DC
  Administered 2020-06-09 – 2020-06-11 (×3): 81 mg via ORAL
  Filled 2020-06-09 (×3): qty 1

## 2020-06-09 MED ORDER — LISINOPRIL 10 MG PO TABS
10.0000 mg | ORAL_TABLET | Freq: Once | ORAL | Status: AC
  Administered 2020-06-09: 16:00:00 10 mg via ORAL
  Filled 2020-06-09: qty 1

## 2020-06-09 MED ORDER — METHYLPREDNISOLONE SODIUM SUCC 125 MG IJ SOLR
125.0000 mg | Freq: Once | INTRAMUSCULAR | Status: AC
Start: 2020-06-09 — End: 2020-06-09
  Administered 2020-06-09: 16:00:00 125 mg via INTRAVENOUS
  Filled 2020-06-09: qty 2

## 2020-06-09 MED ORDER — ONDANSETRON HCL 4 MG/2ML IJ SOLN
4.0000 mg | Freq: Once | INTRAMUSCULAR | Status: AC
  Administered 2020-06-09: 18:00:00 4 mg via INTRAVENOUS
  Filled 2020-06-09: qty 2

## 2020-06-09 MED ORDER — BUDESONIDE 0.5 MG/2ML IN SUSP
2.0000 mg | Freq: Four times a day (QID) | RESPIRATORY_TRACT | Status: DC
  Filled 2020-06-09: qty 8

## 2020-06-09 MED ORDER — HYDROCHLOROTHIAZIDE 25 MG PO TABS
25.0000 mg | ORAL_TABLET | Freq: Every day | ORAL | Status: DC
  Administered 2020-06-10 – 2020-06-11 (×2): 25 mg via ORAL
  Filled 2020-06-09 (×2): qty 1

## 2020-06-09 MED ORDER — BUDESONIDE 0.5 MG/2ML IN SUSP: 2.0000 mg | Freq: Two times a day (BID) | RESPIRATORY_TRACT | Status: DC

## 2020-06-09 NOTE — H&P (Signed)
History and Physical    [name]:[id number] DOB: 1971-01-26 DOA: 06/09/2020  PCP: Smith Robert, MD (Confirm with patient/family/NH records and if not entered, this has to be entered at [facility] point of entry) Patient coming from: Home  I have personally briefly reviewed patient's old medical records in [facility]  Chief Complaint: Chest pain  HPI: [name] is a 49 y.o. female with medical history significant of HTN noncompliant with BP meds, COPD noncompliant with recent meds, chronic pain syndrome, anxiety depression, presented with new onset of chest pain and shortness of breath.  Patient ran out of her HCTZ 4 days ago and about 3 days ago she started to have cough with scanty yellowish sputum, wheezing and pleuritic chest pain.  Denied fever chills.  This morning, sharp-like chest pain woke her up 6-7/10,, hard to take deep breaths will trigger significant worsening of chest pains.  EMS arrived, she was given aspirin 325 milligrams and nitroglycerin with minimal improvement.  ED Course: Blood pressure significantly elevated with systolic in the 170s and diastolic in the upper 100s.  Trop 324 1st set, EKG no ischemic changes.  Chest x-ray showed multifocal interest suspicious for atypical pneumonia versus edema.  Review of Systems: As per HPI otherwise 14 point review of systems negative.    Past Medical History:  Diagnosis Date  . Anxiety   . COPD (chronic obstructive pulmonary disease) (HCC)   . Depression   . Hypertension     Past Surgical History:  Procedure Laterality Date  . BREAST BIOPSY Left 2015   benign  . CESAREAN SECTION    . TUBAL LIGATION       reports that she has been smoking cigarettes. She has been smoking about 0.50 packs per day. She has never used smokeless tobacco. She reports that she does not drink alcohol and does not use drugs.  Allergies  Allergen Reactions  . Coconut Oil Shortness Of Breath  . Latex Swelling  . Peach [Prunus  Persica] Hives  . Toradol [Ketorolac Tromethamine] Itching  . Tramadol Swelling    Family History  Problem Relation Age of Onset  . Breast cancer Paternal Grandmother      Prior to Admission medications   Medication Sig Start Date End Date Taking? Authorizing Provider  Aspirin-Salicylamide-Caffeine [phone] MG PACK Take 1 packet by mouth daily as needed (for pain).    Yes [provider]  hydrochlorothiazide (HYDRODIURIL) 25 MG tablet Take 25 mg by mouth daily. 07/31/17  Yes [provider]  venlafaxine (EFFEXOR) 75 MG tablet Take 75 mg by mouth 2 (two) times daily.   Yes [provider]  diclofenac (VOLTAREN) 75 MG EC tablet Take 1 tablet (75 mg total) by mouth 2 (two) times daily. Take with food Patient not taking: No sig reported 06/16/17   Dione Booze, MD  ferrous sulfate 325 (65 FE) MG tablet Take 325 mg by mouth daily with breakfast. Patient not taking: Reported on 06/09/2020    [provider]  HYDROcodone-acetaminophen (NORCO/VICODIN) 5-325 MG tablet Take one tab po q 4-6 hrs prn pain Patient not taking: No sig reported 07/01/15   Triplett, Tammy, PA-C  oxyCODONE-acetaminophen (PERCOCET) 10-325 MG tablet Take 1 tablet by mouth 4 (four) times daily as needed. 06/07/20   [provider]    Physical Exam: Vitals:   06/09/20 1630 06/09/20 1700 06/09/20 1730 06/09/20 1800  BP: (!) 166/107 (!) 172/91 (!) 163/85 (!) 167/103  Pulse: 76 74 85 86  Resp:  12 15 16 13   Temp:      TempSrc:      SpO2: 98% 98% 99% 98%  Weight:      Height:        Constitutional: NAD, calm, comfortable Vitals:   06/09/20 1630 06/09/20 1700 06/09/20 1730 06/09/20 1800  BP: (!) 166/107 (!) 172/91 (!) 163/85 (!) 167/103  Pulse: 76 74 85 86  Resp: 12 15 16 13   Temp:      TempSrc:      SpO2: 98% 98% 99% 98%  Weight:      Height:       Eyes: PERRL, lids and conjunctivae normal ENMT: Mucous membranes are moist. Posterior pharynx clear of any exudate  or lesions.Normal dentition.  Neck: normal, supple, no masses, no thyromegaly Respiratory: Diminished breathing sound bilaterally, diffused wheezing, fine crackles bilateral lower fields.  Increasing respiratory effort. No accessory muscle use.  Cardiovascular: Regular rate and rhythm, no murmurs / rubs / gallops. No extremity edema. 2+ pedal pulses. No carotid bruits.  Abdomen: no tenderness, no masses palpated. No hepatosplenomegaly. Bowel sounds positive.  Musculoskeletal: no clubbing / cyanosis. No joint deformity upper and lower extremities. Good ROM, no contractures. Normal muscle tone.  Skin: no rashes, lesions, ulcers. No induration Neurologic: CN 2-12 grossly intact. Sensation intact, DTR normal. Strength 5/5 in all 4.  Psychiatric: Normal judgment and insight. Alert and oriented x 3. Normal mood.     Labs on Admission: I have personally reviewed following labs and imaging studies  CBC: Recent Labs  Lab 06/09/20 1514  WBC 9.0  NEUTROABS 7.0  HGB 10.0*  HCT 32.6*  MCV 90.3  PLT 311   Basic Metabolic Panel: Recent Labs  Lab 06/09/20 1514  NA 137  K 3.7  CL 103  CO2 24  GLUCOSE 100*  BUN 17  CREATININE 0.75  CALCIUM 8.6*   GFR: Estimated Creatinine Clearance: 100.4 mL/min (by C-G formula based on SCr of 0.75 mg/dL). Liver Function Tests: No results for input(s): AST, ALT, ALKPHOS, BILITOT, PROT, ALBUMIN in the last 168 hours. No results for input(s): LIPASE, AMYLASE in the last 168 hours. No results for input(s): AMMONIA in the last 168 hours. Coagulation Profile: No results for input(s): INR, PROTIME in the last 168 hours. Cardiac Enzymes: No results for input(s): CKTOTAL, CKMB, CKMBINDEX, TROPONINI in the last 168 hours. BNP (last 3 results) No results for input(s): PROBNP in the last 8760 hours. HbA1C: No results for input(s): HGBA1C in the last 72 hours. CBG: No results for input(s): GLUCAP in the last 168 hours. Lipid Profile: No results for input(s):  CHOL, HDL, LDLCALC, TRIG, CHOLHDL, LDLDIRECT in the last 72 hours. Thyroid Function Tests: No results for input(s): TSH, T4TOTAL, FREET4, T3FREE, THYROIDAB in the last 72 hours. Anemia Panel: No results for input(s): VITAMINB12, FOLATE, FERRITIN, TIBC, IRON, RETICCTPCT in the last 72 hours. Urine analysis:    Component Value Date/Time   COLORURINE YELLOW 02/02/2014 1416   APPEARANCEUR CLEAR 02/02/2014 1416   LABSPEC >1.030 (H) 02/02/2014 1416   PHURINE 5.5 02/02/2014 1416   GLUCOSEU NEGATIVE 02/02/2014 1416   HGBUR LARGE (A) 02/02/2014 1416   BILIRUBINUR NEGATIVE 02/02/2014 1416   KETONESUR NEGATIVE 02/02/2014 1416   PROTEINUR NEGATIVE 02/02/2014 1416   UROBILINOGEN 0.2 02/02/2014 1416   NITRITE NEGATIVE 02/02/2014 1416   LEUKOCYTESUR NEGATIVE 02/02/2014 1416    Radiological Exams on Admission: DG Chest Port 1 View  Result Date: 06/09/2020 CLINICAL DATA:  Chest pain and cough EXAM: PORTABLE CHEST  1 VIEW COMPARISON:  None. FINDINGS: There is ill-defined airspace opacity throughout the lungs bilaterally. No consolidation. Heart is upper normal in size with pulmonary vascularity normal. No adenopathy. No bone lesions. IMPRESSION: Ill-defined patchy airspace opacity bilaterally. Suspect atypical organism pneumonia throughout lungs bilaterally. No consolidation. Heart upper normal in size.  No evident adenopathy. Electronically Signed   By: Bretta Bang III M.D.   On: 06/09/2020 15:31    EKG: Independently reviewed.  NSR, no acute ST-T changes  Assessment/Plan Active Problems:   NSTEMI (non-ST elevated myocardial infarction) (HCC)  (please populate well all problems here in Problem List. (For example, if patient is on BP meds at home and you resume or decide to hold them, it is a problem that needs to be her. Same for CAD, COPD, HLD and so on)  NSTEMI -Start ACS meds, heparin drip, beta-blocker and ACE inhibitor -Lasix x1 -Restart HCTZ tomorrow versus continue loop diuresis  depends on symptoms -Cardiology on board. -Trend troponin tonight and tomorrow morning, repeat EKG in AM. -UDS  HTN emergency -As above, Lasix x1, beta-blocker and ACE inhibitor, PRN hydralazine.  Acute diastolic CHF decompensation -Likely from hypertension emergency, bring down blood pressure, Lasix x1 -Echocardiogram  Acute COPD exacerbation - vs cardiac asthma -Given the patient reported increased green/yellowish sputum production and active wheezing, will treat COPD as well with breathing meds and p.o. doxycycline.  Cigarette smoke -Educate about quit smoking at bedside, nicotine patch ordered.  DVT prophylaxis: Heparin drip Code Status: Full code Family Communication: None at bedside Disposition Plan: Expect more than 2 midnight hospital stay to treat MI and COPD exacerbation. Consults called: Cardiology Admission status: PCU   Emeline General MD [facility] Pager [phone]  06/09/2020, 7:02 PM

## 2020-06-09 NOTE — ED Triage Notes (Signed)
Per EMS, chest pain and shortness of breath for last several days. Pt reports chest pain episode this am since 2 am. Pt reports is out of antihypertensive medication for last several days. EMS administered nitro x3 and 324 mg of aspirin. Pt reports pain decreased from 6/10 to 5/10. Pt requesting to use restroom at time of arrival. nad noted.

## 2020-06-09 NOTE — Progress Notes (Signed)
Called ED to obtain report on pt being admitted into ICU 6. RN unavailable at this time. Awaiting return call.

## 2020-06-09 NOTE — ED Provider Notes (Signed)
[facility] EMERGENCY DEPARTMENT Provider Note   CSN: [id number] Arrival date & time: 06/09/20  1455     History Chief Complaint  Patient presents with  . Chest Pain    [name] is a 49 y.o. female.  HPI      [name] is a 49 y.o. female, with a history of Anxiety, depression, tobacco use, presenting to the ED with chest pain noted around 2 AM this morning.  Pain is in the central chest, only present upon deep breathing, sharp, 6/10, nonradiating. Patient also endorses approximately 3 to 5 days of acute cough productive of green/gray sputum.  She states she has not been able to get to the pharmacy to fill her medications for the past 3 days due to feeling poorly.  No known previous Covid infection.  No Covid vaccination.  Patient received 324 mg aspirin and nitroglycerin via EMS prior to arrival without improvement.  Denies fever/chills, N/V/D, dizziness, syncope, acute back pain, abdominal pain, shortness of breath, or any other complaints.  Past Medical History:  Diagnosis Date  . Anxiety   . COPD (chronic obstructive pulmonary disease) (HCC)   . Depression   . Hypertension     There are no problems to display for this patient.   Past Surgical History:  Procedure Laterality Date  . BREAST BIOPSY Left 2015   benign  . CESAREAN SECTION    . TUBAL LIGATION       OB History   No obstetric history on file.     Family History  Problem Relation Age of Onset  . Breast cancer Paternal Grandmother     Social History   Tobacco Use  . Smoking status: Current Every Day Smoker    Packs/day: 0.50    Types: Cigarettes  . Smokeless tobacco: Never Used  Substance Use Topics  . Alcohol use: No  . Drug use: No    Home Medications Prior to Admission medications   Medication Sig Start Date End Date Taking? Authorizing Provider  Aspirin-Salicylamide-Caffeine [phone] MG PACK Take 1 packet by mouth daily as needed (for pain).    Yes [provider]  hydrochlorothiazide (HYDRODIURIL) 25 MG tablet Take 25 mg by mouth daily. 07/31/17  Yes [provider]  venlafaxine (EFFEXOR) 75 MG tablet Take 75 mg by mouth 2 (two) times daily.   Yes [provider]  diclofenac (VOLTAREN) 75 MG EC tablet Take 1 tablet (75 mg total) by mouth 2 (two) times daily. Take with food Patient not taking: No sig reported 06/16/17   Dione Booze, MD  ferrous sulfate 325 (65 FE) MG tablet Take 325 mg by mouth daily with breakfast. Patient not taking: Reported on 06/09/2020    [provider]  HYDROcodone-acetaminophen (NORCO/VICODIN) 5-325 MG tablet Take one tab po q 4-6 hrs prn pain Patient not taking: No sig reported 07/01/15   Triplett, Tammy, PA-C  oxyCODONE-acetaminophen (PERCOCET) 10-325 MG tablet Take 1 tablet by mouth 4 (four) times daily as needed. 06/07/20   [provider]    Allergies    Coconut oil, Latex, Peach [prunus persica], Toradol [ketorolac tromethamine], and Tramadol  Review of Systems   Review of Systems  Constitutional: Negative for chills and fever.  Respiratory: Positive for cough.   Cardiovascular: Positive for chest pain. Negative for leg swelling.  Gastrointestinal: Negative for abdominal pain, diarrhea, nausea and vomiting.  Musculoskeletal: Negative for back pain.  Neurological: Negative for dizziness, syncope and weakness.  All other systems reviewed  and are negative.   Physical Exam Updated Vital Signs BP (!) 168/99   Pulse 88   Temp 98.7 F (37.1 C) (Oral)   Resp 19   Ht 5' 3.5" (1.613 m)   Wt 106.6 kg   LMP 05/10/2020   SpO2 99%   BMI 40.98 kg/m   Physical Exam Vitals and nursing note reviewed. Exam conducted with a chaperone present.  Constitutional:      General: She is not in acute distress.    Appearance: She is well-developed. She is not diaphoretic.  HENT:     Head: Normocephalic and atraumatic.     Mouth/Throat:     Mouth: Mucous membranes are moist.      Pharynx: Oropharynx is clear.  Eyes:     Conjunctiva/sclera: Conjunctivae normal.  Cardiovascular:     Rate and Rhythm: Normal rate and regular rhythm.     Pulses: Normal pulses.          Radial pulses are 2+ on the right side and 2+ on the left side.       Posterior tibial pulses are 2+ on the right side and 2+ on the left side.     Heart sounds: Normal heart sounds.     Comments: Tactile temperature in the extremities appropriate and equal bilaterally. Pulmonary:     Effort: Pulmonary effort is normal. No respiratory distress.     Breath sounds: Rhonchi present.  Chest:     Chest wall: Tenderness present. No deformity, swelling or crepitus.    Abdominal:     Palpations: Abdomen is soft.     Tenderness: There is no abdominal tenderness. There is no guarding.  Musculoskeletal:     Cervical back: Neck supple.     Right lower leg: No edema.     Left lower leg: No edema.  Lymphadenopathy:     Cervical: No cervical adenopathy.  Skin:    General: Skin is warm and dry.  Neurological:     Mental Status: She is alert.  Psychiatric:        Mood and Affect: Mood and affect normal.        Speech: Speech normal.        Behavior: Behavior normal.     ED Results / Procedures / Treatments   Labs (all labs ordered are listed, but only abnormal results are displayed) Labs Reviewed  BASIC METABOLIC PANEL - Abnormal; Notable for the following components:      Result Value   Glucose, Bld 100 (*)    Calcium 8.6 (*)    All other components within normal limits  CBC WITH DIFFERENTIAL/PLATELET - Abnormal; Notable for the following components:   RBC 3.61 (*)    Hemoglobin 10.0 (*)    HCT 32.6 (*)    All other components within normal limits  TROPONIN I (HIGH SENSITIVITY) - Abnormal; Notable for the following components:   Troponin I (High Sensitivity) 245 (*)    All other components within normal limits  RESP PANEL BY RT-PCR (FLU A&B, COVID) ARPGX2  CULTURE, BLOOD (ROUTINE X 2)   CULTURE, BLOOD (ROUTINE X 2)  PREGNANCY, URINE  LACTIC ACID, PLASMA  LACTIC ACID, PLASMA  HEPARIN LEVEL (UNFRACTIONATED)  HEPARIN LEVEL (UNFRACTIONATED)  CBC  TROPONIN I (HIGH SENSITIVITY)    EKG EKG Interpretation  Date/Time:  Wednesday June 09 2020 15:13:42 EST Ventricular Rate:  84 PR Interval:    QRS Duration: 71 QT Interval:  365 QTC Calculation: 432 R Axis:   -44  Text Interpretation: Sinus rhythm Left axis deviation Anterior infarct, old Baseline wander in lead(s) V6 since last tracing no significant change Confirmed by Mancel Bale [phone]) on 06/09/2020 4:02:47 PM   EKG Interpretation  Date/Time:  Wednesday June 09 2020 15:13:42 EST Ventricular Rate:  84 PR Interval:    QRS Duration: 71 QT Interval:  365 QTC Calculation: 432 R Axis:   -44 Text Interpretation: Sinus rhythm Left axis deviation Anterior infarct, old Baseline wander in lead(s) V6 since last tracing no significant change Confirmed by Mancel Bale [phone]) on 06/09/2020 4:02:47 PM       Radiology DG Chest Port 1 View  Result Date: 06/09/2020 CLINICAL DATA:  Chest pain and cough EXAM: PORTABLE CHEST 1 VIEW COMPARISON:  None. FINDINGS: There is ill-defined airspace opacity throughout the lungs bilaterally. No consolidation. Heart is upper normal in size with pulmonary vascularity normal. No adenopathy. No bone lesions. IMPRESSION: Ill-defined patchy airspace opacity bilaterally. Suspect atypical organism pneumonia throughout lungs bilaterally. No consolidation. Heart upper normal in size.  No evident adenopathy. Electronically Signed   By: Bretta Bang III M.D.   On: 06/09/2020 15:31    Procedures .Critical Care Performed by: Anselm Pancoast, PA-C Authorized by: Anselm Pancoast, PA-C   Critical care provider statement:    Critical care time (minutes):  45   Critical care time was exclusive of:  Separately billable procedures and treating other patients   Critical care was necessary to  treat or prevent imminent or life-threatening deterioration of the following conditions: NSTEMI.   Critical care was time spent personally by me on the following activities:  Ordering and performing treatments and interventions, ordering and review of laboratory studies, ordering and review of radiographic studies, re-evaluation of patient's condition, pulse oximetry, review of old charts, development of treatment plan with patient or surrogate, discussions with consultants, examination of patient, obtaining history from patient or surrogate and evaluation of patient's response to treatment   I assumed direction of critical care for this patient from another provider in my specialty: no     Care discussed with: admitting provider     (including critical care time)  Medications Ordered in ED Medications  azithromycin (ZITHROMAX) 500 mg in sodium chloride 0.9 % 250 mL IVPB (500 mg Intravenous New Bag/Given 06/09/20 1813)  heparin bolus via infusion 4,000 Units (has no administration in time range)  heparin ADULT infusion 100 units/mL (25000 units/256mL) (has no administration in time range)  oxyCODONE-acetaminophen (PERCOCET/ROXICET) 5-325 MG per tablet 1 tablet (1 tablet Oral Given 06/09/20 1531)  lisinopril (ZESTRIL) tablet 10 mg (10 mg Oral Given 06/09/20 1531)  methylPREDNISolone sodium succinate (SOLU-MEDROL) 125 mg/2 mL injection 125 mg (125 mg Intravenous Given 06/09/20 1602)  cefTRIAXone (ROCEPHIN) 1 g in sodium chloride 0.9 % 100 mL IVPB (0 g Intravenous Stopped 06/09/20 1803)  morphine 4 MG/ML injection 4 mg (4 mg Intravenous Given 06/09/20 1738)  ondansetron (ZOFRAN) injection 4 mg (4 mg Intravenous Given 06/09/20 1737)    ED Course  I have reviewed the triage vital signs and the nursing notes.  Pertinent labs & imaging results that were available during my care of the patient were reviewed by me and considered in my medical decision making (see chart for details).  Clinical Course  as of 06/09/20 1829  Wed Jun 09, 2020  1530 Patient updated on lab and chest x-ray results as well as troponin elevation.  Patient continues to only have pain with deep inspiration. [SJ]  1758 Spoke with Dr.  Allyson Sabal, cardiology.  Agrees with treatment for NSTEMI with ASA and heparin. Treat her noted lung infiltrates. Admit the patient to [facility] for trended troponins and continued management. [SJ]  1827 Spoke with Dr. Chipper Herb, hospitalist. Agrees to admit the patient. [SJ]    Clinical Course User Index [SJ] Aliyanah Rozas, Hillard Danker, PA-C   MDM Rules/Calculators/A&P                          Patient presents with complaint of chest pain.  Patient is nontoxic appearing, afebrile, not tachycardic, not tachypneic, not hypotensive, maintains excellent SPO2 on room air.  I have reviewed the patient's chart to obtain more information.   I personally reviewed and interpreted the patient's labs and imaging studies. Evidence of bilateral multifocal pneumonia on chest x-ray. No leukocytosis.  Low suspicion for sepsis.  Covid negative. She does have elevation in troponin.  Treatment initiated for NSTEMI.  Lactic acid and second troponin pending at time of admission.  Findings and plan of care discussed with attending physician, Mancel Bale, MD. Dr. Effie Shy personally evaluated and examined this patient.  Vitals:   06/09/20 1508 06/09/20 1530 06/09/20 1600 06/09/20 1630  BP: (!) 168/99 (!) 168/84 (!) 166/91 (!) 166/107  Pulse: 88 69 78 76  Resp: 19 11 13 12   Temp: 98.7 F (37.1 C)     TempSrc: Oral     SpO2: 99% 99% 98% 98%  Weight:      Height:         Final Clinical Impression(s) / ED Diagnoses Final diagnoses:  Multifocal pneumonia  NSTEMI (non-ST elevated myocardial infarction) [facility])    Rx / DC Orders ED Discharge Orders    None       [facility] 06/09/20 1831    06/11/20, MD 06/18/20 1729

## 2020-06-09 NOTE — ED Provider Notes (Signed)
  Face-to-face evaluation   History: She presents for evaluation of cough with chest discomfort for several days.  Cough began first.  Cough is productive of "gray sputum."  She is a smoker and has COPD, but does not take medicine for it.  Physical exam: Overweight female who is alert.  No respiratory distress.  Heart regular rate and rhythm without murmur.  Lungs clear anteriorly.  There is no increased work of breathing.  Medical screening examination/treatment/procedure(s) were conducted as a shared visit with non-physician practitioner(s) and myself.  I personally evaluated the patient during the encounter    Mancel Bale, MD 06/18/20 1729

## 2020-06-09 NOTE — Progress Notes (Signed)
ANTICOAGULATION CONSULT NOTE - Initial Consult  Pharmacy Consult for heparin gtt  Indication: chest pain/ACS  Allergies  Allergen Reactions  . Coconut Oil Shortness Of Breath  . Latex Swelling  . Peach [Prunus Persica] Hives  . Toradol [Ketorolac Tromethamine] Itching  . Tramadol Swelling    Patient Measurements: Height: 5' 3.5" (161.3 cm) Weight: 106.6 kg (235 lb) IBW/kg (Calculated) : 53.55 Heparin Dosing Weight: HEPARIN DW (KG): 78.8   Vital Signs: Temp: 98.7 F (37.1 C) (12/22 1508) Temp Source: Oral (12/22 1508) BP: 163/85 (12/22 1730) Pulse Rate: 85 (12/22 1730)  Labs: Recent Labs    06/09/20 1514  HGB 10.0*  HCT 32.6*  PLT 311  CREATININE 0.75    Estimated Creatinine Clearance: 100.4 mL/min (by C-G formula based on SCr of 0.75 mg/dL).   Medical History: Past Medical History:  Diagnosis Date  . Anxiety   . COPD (chronic obstructive pulmonary disease) (HCC)   . Depression   . Hypertension     Medications:  (Not in a hospital admission)  Scheduled:  . heparin  4,000 Units Intravenous Once   Infusions:  . azithromycin 500 mg (06/09/20 1813)  . heparin     PRN:  Anti-infectives (From admission, onward)   Start     Dose/Rate Route Frequency Ordered Stop   06/09/20 1715  cefTRIAXone (ROCEPHIN) 1 g in sodium chloride 0.9 % 100 mL IVPB        1 g 200 mL/hr over 30 Minutes Intravenous  Once 06/09/20 1710 06/09/20 1803   06/09/20 1715  azithromycin (ZITHROMAX) 500 mg in sodium chloride 0.9 % 250 mL IVPB        500 mg 250 mL/hr over 60 Minutes Intravenous  Once 06/09/20 1710        Assessment: [name] a 49 y.o. female requires anticoagulation with a heparin iv infusion for the indication of  chest pain/ACS. Heparin gtt will be started following pharmacy protocol per pharmacy consult. Patient is not on previous oral anticoagulant that will require aPTT/HL correlation before transitioning to only HL monitoring.   Goal of Therapy:  Heparin  level 0.3-0.7 units/ml Monitor platelets by anticoagulation protocol: Yes   Plan:  Give 4000 units bolus x 1 Start heparin infusion at 950 units/hr Check anti-Xa level in 6 hours and daily while on heparin Continue to monitor H&H and platelets   Gerre Pebbles Malka Bocek 06/09/2020,6:17 PM

## 2020-06-09 NOTE — Progress Notes (Signed)
UDS positive for Cocaine.  Went to confront pt pt about the UDS result. Pt admitted used Cocaine 5 days ago by snorting. Will add Ativan BID for two days. Educated pt about danger associated with cocaine use, pt expressed understanding.

## 2020-06-09 NOTE — ED Notes (Addendum)
Date and time results received: 06/09/20 1726   Test: Troponin Critical Value: 245  Name of Provider Notified: Joy, PA  Orders Received? Or Actions Taken?: No new orders given.

## 2020-06-10 ENCOUNTER — Inpatient Hospital Stay (HOSPITAL_COMMUNITY): Payer: Self-pay

## 2020-06-10 DIAGNOSIS — I214 Non-ST elevation (NSTEMI) myocardial infarction: Secondary | ICD-10-CM

## 2020-06-10 DIAGNOSIS — I361 Nonrheumatic tricuspid (valve) insufficiency: Secondary | ICD-10-CM

## 2020-06-10 DIAGNOSIS — I10 Essential (primary) hypertension: Secondary | ICD-10-CM | POA: Diagnosis present

## 2020-06-10 DIAGNOSIS — F1410 Cocaine abuse, uncomplicated: Secondary | ICD-10-CM | POA: Diagnosis present

## 2020-06-10 DIAGNOSIS — I509 Heart failure, unspecified: Secondary | ICD-10-CM

## 2020-06-10 DIAGNOSIS — F1490 Cocaine use, unspecified, uncomplicated: Secondary | ICD-10-CM

## 2020-06-10 DIAGNOSIS — J449 Chronic obstructive pulmonary disease, unspecified: Secondary | ICD-10-CM

## 2020-06-10 DIAGNOSIS — J189 Pneumonia, unspecified organism: Secondary | ICD-10-CM

## 2020-06-10 LAB — ECHOCARDIOGRAM COMPLETE
Area-P 1/2: 3.2 cm2
Height: 63.5 in
S' Lateral: 2.4 cm
Weight: 3760 oz

## 2020-06-10 LAB — CBC
HCT: 35.6 % — ABNORMAL LOW (ref 36.0–46.0)
Hemoglobin: 11 g/dL — ABNORMAL LOW (ref 12.0–15.0)
MCH: 27.9 pg (ref 26.0–34.0)
MCHC: 30.9 g/dL (ref 30.0–36.0)
MCV: 90.4 fL (ref 80.0–100.0)
Platelets: 369 10*3/uL (ref 150–400)
RBC: 3.94 MIL/uL (ref 3.87–5.11)
RDW: 14.6 % (ref 11.5–15.5)
WBC: 8.3 10*3/uL (ref 4.0–10.5)
nRBC: 0 % (ref 0.0–0.2)

## 2020-06-10 LAB — BASIC METABOLIC PANEL
Anion gap: 11 (ref 5–15)
BUN: 22 mg/dL — ABNORMAL HIGH (ref 6–20)
CO2: 23 mmol/L (ref 22–32)
Calcium: 8.6 mg/dL — ABNORMAL LOW (ref 8.9–10.3)
Chloride: 102 mmol/L (ref 98–111)
Creatinine, Ser: 0.82 mg/dL (ref 0.44–1.00)
GFR, Estimated: >60 mL/min (ref >60)
Glucose, Bld: 175 mg/dL — ABNORMAL HIGH (ref 70–99)
Potassium: 4 mmol/L (ref 3.5–5.1)
Sodium: 136 mmol/L (ref 135–145)

## 2020-06-10 LAB — LIPID PANEL
Cholesterol: 155 mg/dL (ref 0–200)
HDL: 33 mg/dL — ABNORMAL LOW (ref >40)
LDL Cholesterol: 98 mg/dL (ref 0–99)
Total CHOL/HDL Ratio: 4.7 RATIO
Triglycerides: 118 mg/dL (ref <150)
VLDL: 24 mg/dL (ref 0–40)

## 2020-06-10 LAB — HEPARIN LEVEL (UNFRACTIONATED)
Heparin Unfractionated: <0.1 IU/mL — ABNORMAL LOW (ref 0.30–0.70)
Heparin Unfractionated: 0.14 IU/mL — ABNORMAL LOW (ref 0.30–0.70)
Heparin Unfractionated: 0.52 IU/mL (ref 0.30–0.70)

## 2020-06-10 LAB — HEMOGLOBIN A1C
Hgb A1c MFr Bld: 5.2 % (ref 4.8–5.6)
Mean Plasma Glucose: 102.54 mg/dL

## 2020-06-10 LAB — TROPONIN I (HIGH SENSITIVITY): Troponin I (High Sensitivity): 102 ng/L (ref <18)

## 2020-06-10 LAB — HIV ANTIBODY (ROUTINE TESTING W REFLEX): HIV Screen 4th Generation wRfx: NONREACTIVE

## 2020-06-10 LAB — MRSA PCR SCREENING: MRSA by PCR: NEGATIVE

## 2020-06-10 MED ORDER — ROSUVASTATIN CALCIUM 20 MG PO TABS
20.0000 mg | ORAL_TABLET | Freq: Every day | ORAL | Status: DC
  Administered 2020-06-10: 18:00:00 20 mg via ORAL
  Filled 2020-06-10: qty 1

## 2020-06-10 MED ORDER — BUDESONIDE 0.5 MG/2ML IN SUSP
0.5000 mg | Freq: Two times a day (BID) | RESPIRATORY_TRACT | Status: DC
  Administered 2020-06-10 – 2020-06-11 (×3): 0.5 mg via RESPIRATORY_TRACT
  Filled 2020-06-10 (×3): qty 2

## 2020-06-10 MED ORDER — HEPARIN BOLUS VIA INFUSION
3000.0000 [IU] | Freq: Once | INTRAVENOUS | Status: AC
  Administered 2020-06-10: 11:00:00 3000 [IU] via INTRAVENOUS
  Filled 2020-06-10: qty 3000

## 2020-06-10 MED ORDER — ENOXAPARIN SODIUM 40 MG/0.4ML ~~LOC~~ SOLN
40.0000 mg | SUBCUTANEOUS | Status: DC
  Administered 2020-06-10: 21:00:00 40 mg via SUBCUTANEOUS
  Filled 2020-06-10: qty 0.4

## 2020-06-10 MED ORDER — CHLORHEXIDINE GLUCONATE CLOTH 2 % EX PADS
6.0000 | MEDICATED_PAD | Freq: Every day | CUTANEOUS | Status: DC
  Administered 2020-06-10: 09:00:00 6 via TOPICAL

## 2020-06-10 MED ORDER — HEPARIN BOLUS VIA INFUSION
3000.0000 [IU] | Freq: Once | INTRAVENOUS | Status: AC
  Administered 2020-06-10: 02:00:00 3000 [IU] via INTRAVENOUS
  Filled 2020-06-10: qty 3000

## 2020-06-10 NOTE — Evaluation (Signed)
Physical Therapy Evaluation Patient Details Name: [name] MRN: [id number] DOB: May 17, 1971 Today's Date: 06/10/2020   History of Present Illness  [name] is a 49 y.o. female with medical history significant of HTN noncompliant with BP meds, COPD noncompliant with recent meds, chronic pain syndrome, anxiety depression, presented with new onset of chest pain and shortness of breath.  Patient ran out of her HCTZ 4 days ago and about 3 days ago she started to have cough with scanty yellowish sputum, wheezing and pleuritic chest pain.  Denied fever chills.  This morning, sharp-like chest pain woke her up 6-7/10,, hard to take deep breaths will trigger significant worsening of chest pains.  EMS arrived, she was given aspirin 325 milligrams and nitroglycerin with minimal improvement.    Clinical Impression  Patient functioning near baseline for functional mobility and gait, demonstrates good return for sitting up at bedside with bed flat and can safely ambulate in room and hallways without loss of balance.  Patient encouraged to ambulate ad lib as tolerated for length of stay as tolerated.  Plan:  Patient discharged from physical therapy to care of nursing for ambulation daily as tolerated for length of stay.     Follow Up Recommendations No PT follow up;Supervision - Intermittent    Equipment Recommendations  None recommended by PT    Recommendations for Other Services       Precautions / Restrictions Precautions Precautions: None Restrictions Weight Bearing Restrictions: No      Mobility  Bed Mobility Overal bed mobility: Modified Independent                  Transfers Overall transfer level: Modified independent                  Ambulation/Gait Ambulation/Gait assistance: Modified independent (Device/Increase time) Gait Distance (Feet): 100 Feet Assistive device: None Gait Pattern/deviations: WFL(Within Functional Limits) Gait velocity: slightly  decreased   General Gait Details: grossly WFL, demonstrates slightly labored cadence without loss of balance and can safely ambulate in room and hallways  Stairs            Wheelchair Mobility    Modified Rankin (Stroke Patients Only)       Balance Overall balance assessment: No apparent balance deficits (not formally assessed)                                           Pertinent Vitals/Pain Pain Assessment: 0-10 Pain Score: 5  Pain Location: chest pain, low back Pain Descriptors / Indicators: Sore Pain Intervention(s): Limited activity within patient's tolerance;Monitored during session;Repositioned    Home Living Family/patient expects to be discharged to:: Private residence Living Arrangements: Spouse/significant other Available Help at Discharge: Family;Available 24 hours/day Type of Home: Mobile home Home Access: Ramped entrance     Home Layout: One level Home Equipment: None;Grab bars - tub/shower      Prior Function Level of Independence: Independent               Hand Dominance        Extremity/Trunk Assessment   Upper Extremity Assessment Upper Extremity Assessment: Defer to OT evaluation    Lower Extremity Assessment Lower Extremity Assessment: Overall WFL for tasks assessed    Cervical / Trunk Assessment Cervical / Trunk Assessment: Normal  Communication   Communication: No difficulties  Cognition Arousal/Alertness: Awake/alert Behavior During Therapy:  WFL for tasks assessed/performed Overall Cognitive Status: Within Functional Limits for tasks assessed                                        General Comments      Exercises     Assessment/Plan    PT Assessment Patent does not need any further PT services  PT Problem List         PT Treatment Interventions      PT Goals (Current goals can be found in the Care Plan section)  Acute Rehab PT Goals Patient Stated Goal: return home with  family to assist PT Goal Formulation: With patient Time For Goal Achievement: 06/10/20 Potential to Achieve Goals: Good    Frequency     Barriers to discharge        Co-evaluation               AM-PAC PT "6 Clicks" Mobility  Outcome Measure Help needed turning from your back to your side while in a flat bed without using bedrails?: None Help needed moving from lying on your back to sitting on the side of a flat bed without using bedrails?: None Help needed moving to and from a bed to a chair (including a wheelchair)?: None Help needed standing up from a chair using your arms (e.g., wheelchair or bedside chair)?: None Help needed to walk in hospital room?: None Help needed climbing 3-5 steps with a railing? : A Little 6 Click Score: 23    End of Session   Activity Tolerance: Patient tolerated treatment well Patient left: in chair;with call bell/phone within reach Nurse Communication: Mobility status PT Visit Diagnosis: Unsteadiness on feet (R26.81);Other abnormalities of gait and mobility (R26.89);Muscle weakness (generalized) (M62.81)    Time: 1610-9604 PT Time Calculation (min) (ACUTE ONLY): 21 min   Charges:   PT Evaluation $PT Eval Moderate Complexity: 1 Mod PT Treatments $Therapeutic Activity: 8-22 mins        10:50 AM, 06/10/20 Ocie Bob, MPT Physical Therapist with [facility] [phone] office [phone] mobile phone

## 2020-06-10 NOTE — TOC Initial Note (Signed)
Transition of Care [facility]) - Initial/Assessment Note    Patient Details  Name: [name] MRN: [id number] Date of Birth: 1971-05-31  Transition of Care [facility] CM/SW Contact:    Laura Brunner, LCSW Phone Number: 06/10/2020, 1:50 PM  Clinical Narrative:                 Patient is a 49 year old female admitted for NSTEMI (non-ST elevated myocardial infarction).  CSW received consult for medication assistance due to patient's ins status. CSW contacted patient who reported that she is agreeable to [facility] referral. Patient reported that her PCP is [facility].CSW referred patient to the financial counselor for medicaid. TOC to follow.  Expected Discharge Plan: Home/Self Care Barriers to Discharge: Continued Medical Work up   Patient Goals and CMS Choice Patient states their goals for this hospitalization and ongoing recovery are:: Return home CMS Medicare.gov Compare Post Acute Care list provided to:: Patient Choice offered to / list presented to : Patient  Expected Discharge Plan and Services Expected Discharge Plan: Home/Self Care In-house Referral: NA Discharge Planning Services: Other - See comment (Medicaid referral)   Living arrangements for the past 2 months: Single Family Home                 DME Arranged: N/A DME Agency: NA       HH Arranged: NA HH Agency: NA        Prior Living Arrangements/Services Living arrangements for the past 2 months: Single Family Home Lives with:: Spouse Patient language and need for interpreter reviewed:: Yes        Need for Family Participation in Patient Care: Yes (Comment) Care giver support system in place?: Yes (comment)   Criminal Activity/Legal Involvement Pertinent to Current Situation/Hospitalization: No - Comment as needed  Activities of Daily Living Home Assistive Devices/Equipment: None ADL Screening (condition at time of admission) Patient's cognitive ability adequate to safely complete daily  activities?: Yes Is the patient deaf or have difficulty hearing?: No Does the patient have difficulty seeing, even when wearing glasses/contacts?: No Does the patient have difficulty concentrating, remembering, or making decisions?: No Patient able to express need for assistance with ADLs?: Yes Does the patient have difficulty dressing or bathing?: No Independently performs ADLs?: Yes (appropriate for developmental age) Does the patient have difficulty walking or climbing stairs?: No Weakness of Legs: None Weakness of Arms/Hands: None  Permission Sought/Granted         Permission granted to share info w AGENCY: Artist        Emotional Assessment     Affect (typically observed): Accepting,Adaptable Orientation: : Oriented to Self,Oriented to Place,Oriented to  Time,Oriented to Situation Alcohol / Substance Use: Illicit Drugs Psych Involvement: No (comment)  Admission diagnosis:  CHF (congestive heart failure) (HCC) [I50.9] NSTEMI (non-ST elevated myocardial infarction) (HCC) [I21.4] Multifocal pneumonia [J18.9] Patient Active Problem List   Diagnosis Date Noted  . Multifocal pneumonia 06/10/2020  . CHF (congestive heart failure) (HCC) 06/10/2020  . Hypertension   . COPD (chronic obstructive pulmonary disease) (HCC)   . Cocaine abuse (HCC)   . NSTEMI (non-ST elevated myocardial infarction) (HCC) 06/09/2020   PCP:  Laura Robert, MD Pharmacy:   [facility] - [place], [place] - [address] [place] [address] Phone: [phone] Fax: [phone]     Social Determinants of Health (SDOH) Interventions    Readmission Risk Interventions No flowsheet data found.

## 2020-06-10 NOTE — Consult Note (Addendum)
Cardiology Consult    Patient ID: [name]; [id number]; 10/05/1970   Admit date: 06/09/2020 Date of Consult: 06/10/2020  Primary Care Provider: Smith Robert, MD Primary Cardiologist: New to [facility] - Dr. Diona Browner  Patient Profile    [name] is a 49 y.o. female with past medical history of HTN, COPD and substance abuse who is being seen today for the evaluation of NSTEMI at the request of Dr. Chipper Herb.   History of Present Illness    [name] presented to [facility] ED on 06/09/2020 for evaluation of chest pain and dyspnea for the past several days. Reports she had been experiencing a productive cough with gray phlegm and worsening dyspnea for the past 2-3 days. No subjective fever or chills. Was using Nyquil with no improvement in her symptoms. Starting two days ago, she developed chest pressure which would occur with coughing. Says she felt she could not take a deep breath. No exertional component to her pain. Says she is active at baseline and works at a [employer]. Denies any exertional chest pain or dyspnea on exertion over the past few weeks. No known personal history of CAD or CHF. Says her mother died in a car accident and father died of Agent Orange exposure at age 54 but no known family history of cardiac issues. She did use Cocaine 5 days prior to admission and says this is the first time she had ever used as she wanted to see if it would help with her sciatic back pain. She does smoke approximately 1 ppd.   BP was elevated to 182/99 upon arrival (reported being without her medications for 3 days). Initial labs showed WBC 9.0, Hgb 10.0, platelets 311, Na+ 137, K+ 3.7 and creatinine 0.75.  COVID negative. Initial HS Troponin 245 with repeat values of 239 and 102. UDS positive for Cocaine and Opiates. CXR showed ill-defined patchy airspace opacity felt to be most consistent with atypical PNA. EKG shows normal sinus rhythm, heart rate 84 with flattened T-waves along  the inferior and lateral leads.  She was started on Heparin at the time of admission along with ASA 81 mg daily. Was also restarted on HCTZ 25 mg daily and Lisinopril 20 mg daily for BP control. Coreg 3.125 mg twice daily was ordered in the setting of her NSTEMI. She has also been placed on antibiotic therapy with doxycycline for possible COPD exacerbation in the setting of her productive cough and active wheezing and received a dose of IV Azithromycin and Rocephin on admission.   Past Medical History:  Diagnosis Date  . Anxiety   . COPD (chronic obstructive pulmonary disease) (HCC)   . Depression   . Hypertension     Past Surgical History:  Procedure Laterality Date  . BREAST BIOPSY Left 2015   benign  . CESAREAN SECTION    . TUBAL LIGATION       Home Medications:  Prior to Admission medications   Medication Sig Start Date End Date Taking? Authorizing Provider  Aspirin-Salicylamide-Caffeine [phone] MG PACK Take 1 packet by mouth daily as needed (for pain).    Yes [provider]  hydrochlorothiazide (HYDRODIURIL) 25 MG tablet Take 25 mg by mouth daily. 07/31/17  Yes [provider]  venlafaxine (EFFEXOR) 75 MG tablet Take 75 mg by mouth 2 (two) times daily.   Yes [provider]  diclofenac (VOLTAREN) 75 MG EC tablet Take 1 tablet (75 mg total) by mouth 2 (two) times daily. Take  with food Patient not taking: No sig reported 06/16/17   Dione BoozeGlick, David, MD  ferrous sulfate 325 (65 FE) MG tablet Take 325 mg by mouth daily with breakfast. Patient not taking: Reported on 06/09/2020    [provider]  HYDROcodone-acetaminophen (NORCO/VICODIN) 5-325 MG tablet Take one tab po q 4-6 hrs prn pain Patient not taking: No sig reported 07/01/15   Triplett, Tammy, PA-C  oxyCODONE-acetaminophen (PERCOCET) 10-325 MG tablet Take 1 tablet by mouth 4 (four) times daily as needed. 06/07/20   [provider]    Inpatient Medications: Scheduled Meds: .  aspirin EC  81 mg Oral Daily  . budesonide (PULMICORT) nebulizer solution  0.5 mg Nebulization BID  . Chlorhexidine Gluconate Cloth  6 each Topical Daily  . doxycycline  100 mg Oral Q12H  . fluticasone furoate-vilanterol  1 puff Inhalation Daily  . hydrochlorothiazide  25 mg Oral Daily  . ipratropium  0.5 mg Nebulization BID  . lisinopril  20 mg Oral Daily  . LORazepam  1 mg Oral BID  . nicotine  21 mg Transdermal Daily  . rosuvastatin  20 mg Oral q1800  . venlafaxine  75 mg Oral BID   Continuous Infusions: . heparin 1,300 Units/hr (06/10/20 0814)   PRN Meds: acetaminophen, hydrALAZINE, nitroGLYCERIN, ondansetron (ZOFRAN) IV, oxyCODONE-acetaminophen  Allergies:    Allergies  Allergen Reactions  . Coconut Oil Shortness Of Breath  . Latex Swelling  . Peach [Prunus Persica] Hives  . Toradol [Ketorolac Tromethamine] Itching  . Tramadol Swelling    Social History:   Social History   Socioeconomic History  . Marital status: Divorced    Spouse name: Not on file  . Number of children: Not on file  . Years of education: Not on file  . Highest education level: Not on file  Occupational History  . Not on file  Tobacco Use  . Smoking status: Current Every Day Smoker    Packs/day: 0.50    Types: Cigarettes  . Smokeless tobacco: Never Used  Substance and Sexual Activity  . Alcohol use: No  . Drug use: No  . Sexual activity: Yes    Birth control/protection: None  Other Topics Concern  . Not on file  Social History Narrative  . Not on file   Social Determinants of Health   Financial Resource Strain: Not on file  Food Insecurity: Not on file  Transportation Needs: Not on file  Physical Activity: Not on file  Stress: Not on file  Social Connections: Not on file  Intimate Partner Violence: Not on file     Family History:    Family History  Problem Relation Age of Onset  . Breast cancer Paternal Grandmother      Review of Systems    General:  No chills, fever,  night sweats or weight changes.  Cardiovascular:  No edema, orthopnea, palpitations, paroxysmal nocturnal dyspnea. Positive for chest pain and dyspnea on exertion.  Dermatological: No rash, lesions/masses Respiratory: Positive for cough and dyspnea. Urologic: No hematuria, dysuria Abdominal:   No nausea, vomiting, diarrhea, bright red blood per rectum, melena, or hematemesis Neurologic:  No visual changes, wkns, changes in mental status. All other systems reviewed and are otherwise negative except as noted above.  Physical Exam/Data    Vitals:   06/10/20 0600 06/10/20 0735 06/10/20 0736 06/10/20 0740  BP: (!) 158/76     Pulse: 82  83   Resp: 11  12   Temp:   98.3 F (36.8 C)   TempSrc:  Oral   SpO2: 93% 100% 100% 100%  Weight:      Height:        Intake/Output Summary (Last 24 hours) at 06/10/2020 0906 Last data filed at 06/10/2020 0029 Gross per 24 hour  Intake 252.56 ml  Output 850 ml  Net -597.44 ml   Filed Weights   06/09/20 1501  Weight: 106.6 kg   Body mass index is 40.98 kg/m.   General: Pleasant female appearing in NAD Psych: Normal affect. Neuro: Alert and oriented X 3. Moves all extremities spontaneously. HEENT: Normal  Neck: Supple without bruits or JVD. Lungs:  Resp regular and unlabored, scattered expiratory wheezing and mild rales. Heart: RRR no s3, s4, or murmurs. Abdomen: Soft, non-tender, non-distended, BS + x 4.  Extremities: No clubbing, cyanosis or edema. DP/PT/Radials 2+ and equal bilaterally.   EKG:  The EKG was personally reviewed and demonstrates: Normal sinus rhythm, heart rate 84 with flattened T-waves along the inferior and lateral leads.  Telemetry:  Telemetry was personally reviewed and demonstrates: NSR, HR in 70's to 80's. No significant arrhythmias.    Labs/Studies     Relevant CV Studies:  None on File.  Laboratory Data:  Chemistry Recent Labs  Lab 06/09/20 1514 06/10/20 0507  NA 137 136  K 3.7 4.0  CL 103 102   CO2 24 23  GLUCOSE 100* 175*  BUN 17 22*  CREATININE 0.75 0.82  CALCIUM 8.6* 8.6*  GFRNONAA >60 >60  ANIONGAP 10 11    No results for input(s): PROT, ALBUMIN, AST, ALT, ALKPHOS, BILITOT in the last 168 hours. Hematology Recent Labs  Lab 06/09/20 1514 06/10/20 0507  WBC 9.0 8.3  RBC 3.61* 3.94  HGB 10.0* 11.0*  HCT 32.6* 35.6*  MCV 90.3 90.4  MCH 27.7 27.9  MCHC 30.7 30.9  RDW 14.7 14.6  PLT 311 369    Radiology/Studies:  DG Chest 1 View  Result Date: 06/10/2020 CLINICAL DATA:  CHF, history of COPD and hypertension EXAM: CHEST  1 VIEW COMPARISON:  Radiograph 06/09/2020 FINDINGS: Increasing opacity is seen in the right upper lobe adjacent the minor fissure with some Blair fissural thickening. This finding is on a background of more diffuse hazy interstitial opacity, cuffing, and pulmonary vascular congestion. No pneumothorax or effusion. No acute osseous or soft tissue abnormality. Degenerative changes are present in the imaged spine and shoulders. Telemetry leads overlie the chest. IMPRESSION: 1. Increasing opacity in the right upper lobe adjacent the minor fissure, could reflect developing infection 2. Additional features could suggest a background of CHF/volume overload with vascular congestion and perhaps mild interstitial edema. Electronically Signed   By: Kreg Shropshire M.D.   On: 06/10/2020 05:33   DG Chest Port 1 View  Result Date: 06/09/2020 CLINICAL DATA:  Chest pain and cough EXAM: PORTABLE CHEST 1 VIEW COMPARISON:  None. FINDINGS: There is ill-defined airspace opacity throughout the lungs bilaterally. No consolidation. Heart is upper normal in size with pulmonary vascularity normal. No adenopathy. No bone lesions. IMPRESSION: Ill-defined patchy airspace opacity bilaterally. Suspect atypical organism pneumonia throughout lungs bilaterally. No consolidation. Heart upper normal in size.  No evident adenopathy. Electronically Signed   By: Bretta Bang III M.D.   On:  06/09/2020 15:31     Assessment & Plan    1. NSTEMI - Suspect secondary to demand ischemia in the setting of PNA and COPD exacerbation but cannot rule-out a coronary vasospasm given her recent cocaine use. Her chest pain seems most consistent with pleuritic pain at  this time as it only occurs with coughing.  - HS Troponin values peaked at 245 and are now down-trending to 102. EKG show snormal sinus rhythm, heart rate 84 with flattened T-waves along the inferior and lateral leads. An echocardiogram is pending to assess LV Function and wall motion. If echo is reassuring, would not anticipate further cardiac testing at this time. If found to have a cardiomyopathy, would need to arrange for a cardiac catheterization for definitive evaluation.  - Continue Heparin for now. She has been started on ASA 81mg  daily and Lisinopril 20mg  daily. Will add Crestor 20mg  daily (LDL 98 this admission). Will stop BB for now given recent cocaine use.   2. Accelerated HTN - SBP was in the 180's upon admission and she reported being without her medications for 2-3 days. She has been restarted on HCTZ 25mg  daily and Lisinopril 20mg  daily has been added to her medication regimen.   3. Substance Use - She reports using Cocaine 5 days prior to admission but denies any use prior to this. Cessation advised.   4. CAP/COPD - She is receiving scheduled nebulizer treatments along with Doxycycline. Received IV Azithromycin and Rocephin on admission. Further management per admitting team.    For questions or updates, please contact [facility] Please consult [email or web address] for contact info under Cardiology/STEMI.  Signed, , PA-C 06/10/2020, 9:06 AM Pager: [phone]   Attending note:  Patient seen and examined.  I reviewed the available records and discussed the case with Ms. , I agree with her above findings.  [name] presents to the hospital with shortness of breath and chest  discomfort for the last few days, also intermittent productive cough but no fevers or chills.  Covid testing is negative.  UDS is positive for cocaine and opiates.  She is in no distress, reports a mild pleuritic chest discomfort, still with intermittent coughing.  Systolic blood pressure 140s to 170s, heart rate in the 80s in sinus rhythm on telemetry which I personally reviewed.  Lungs exhibit coarse breath sounds with scattered rhonchi and wheezing.  Cardiac exam with RRR no gallop or rub.  Pertinent lab work includes potassium 4.0, creatinine 0.82, LDL 98, hemoglobin 11.0, platelets 369, hemoglobin A1c 5.2%, peak high-sensitivity troponin I 245 down to 102.  Chest x-ray shows possible right upper lobe infiltrate with interstitial edema.  I personally reviewed her ECG shows sinus rhythm with poor R wave progression rule out old anterior infarct pattern, leftward axis, nonspecific ST-T changes.  Suspected type II NSTEMI related to demand ischemia in the setting of recent cocaine use, also possible COPD exacerbation and community-acquired pneumonia.  She is on broad-spectrum antibiotics per primary team, echocardiogram ordered to assess LVEF.  Continue aspirin, heparin, lisinopril, and Crestor.  No beta-blocker with recent cocaine use.  Further recommendations to follow pending review of echocardiogram.  Ellsworth Lennox, M.D., F.A.C.C.

## 2020-06-10 NOTE — Progress Notes (Signed)
ANTICOAGULATION CONSULT NOTE - Follow Up Consult  Pharmacy Consult for heparin Indication: NSTEMI  Labs: Recent Labs    06/09/20 1514 06/09/20 1714 06/10/20 0030  HGB 10.0*  --   --   HCT 32.6*  --   --   PLT 311  --   --   HEPARINUNFRC  --   --  <0.10*  CREATININE 0.75  --   --   TROPONINIHS 245* 239*  --     Assessment: 49yo female subtherapeutic on heparin with initial dosing for NSTEMI; no gtt issues or signs of bleeding per RN.  Goal of Therapy:  Heparin level 0.3-0.7 units/ml   Plan:  Will rebolus with heparin 3000 units and increase heparin gtt by 4 units/kgABW/hr to 1300 units/hr and check level in 6 hours.    Vernard Gambles, PharmD, BCPS  06/10/2020,1:36 AM

## 2020-06-10 NOTE — Progress Notes (Addendum)
PROGRESS NOTE   [name]  HYW:[id number] DOB: 1970-11-30 DOA: 06/09/2020 PCP: Smith Robert, MD   Chief Complaint  Patient presents with  . Chest Pain    Brief Admission History:  49 y.o. female with medical history significant of HTN noncompliant with BP meds, COPD noncompliant with recent meds, chronic pain syndrome, anxiety depression, presented with new onset of chest pain and shortness of breath.  Patient ran out of her HCTZ 4 days ago and about 3 days ago she started to have cough with scanty yellowish sputum, wheezing and pleuritic chest pain.  Denied fever chills.  This morning, sharp-like chest pain woke her up 6-7/10,, hard to take deep breaths will trigger significant worsening of chest pains.  EMS arrived, she was given aspirin 325 milligrams and nitroglycerin with minimal improvement.  ED Course: Blood pressure significantly elevated with systolic in the 170s and diastolic in the upper 100s.  Trop 324 1st set, EKG no ischemic changes.  Chest x-ray showed multifocal interest suspicious for atypical pneumonia versus edema.   Assessment & Plan:   Active Problems:   NSTEMI (non-ST elevated myocardial infarction) (HCC)   Multifocal pneumonia   CHF (congestive heart failure) (HCC)   Hypertension   COPD (chronic obstructive pulmonary disease) (HCC)   Cocaine abuse (HCC)   1. NSTEMI - Pt remains on IV heparin infusion, 2D echocardiogram pending, cardiology consultation requested.  Further recommendations to follow.  2. Cocaine use - Pt reports at least 5-6 days since last use.  Holding carvedilol for now.   3. Essential hypertensive with hypertensive urgency - BP much better controlled, follow and adjust therapy as needed.  4. COPD - continue bronchodilators. No acute exacerbation.  5. Heart Failure preserved EF - mild edema on CXR - 2D echocardiogram pending.  6. CAP - Pt is being treated with doxycycline and supportive measures.    DVT prophylaxis: IV heparin  infusion  Code Status: full  Family Communication:  Disposition: home when medically cleared   Status is: Inpatient  Remains inpatient appropriate because:IV treatments appropriate due to intensity of illness or inability to take PO and Inpatient level of care appropriate due to severity of illness   Dispo: The patient is from: Home              Anticipated d/c is to: Home              Anticipated d/c date is: 2 days              Patient currently is not medically stable to d/c.  Consultants:   Cardiology   Procedures:   2D echocardiogram pending   Antimicrobials:  n/a  Subjective: Pt reports centralized chest pain this morning but overall better than when initially presented to ED  Objective: Vitals:   06/10/20 0740 06/10/20 0800 06/10/20 0900 06/10/20 1119  BP:  (!) 178/85 138/60   Pulse:  91 83 87  Resp:  16 13 11   Temp:    97.9 F (36.6 C)  TempSrc:    Oral  SpO2: 100% 98% 94% 100%  Weight:      Height:        Intake/Output Summary (Last 24 hours) at 06/10/2020 1240 Last data filed at 06/10/2020 0029 Gross per 24 hour  Intake 252.56 ml  Output 850 ml  Net -597.44 ml   Filed Weights   06/09/20 1501  Weight: 106.6 kg    Examination:  General exam: somnolent but arousable, Appears calm and comfortable  Respiratory system: Clear to auscultation. Respiratory effort normal. Cardiovascular system: S1 & S2 heard, RRR. No JVD, murmurs, rubs, gallops or clicks. No pedal edema. Gastrointestinal system: Abdomen is nondistended, soft and nontender. No organomegaly or masses felt. Normal bowel sounds heard. Central nervous system: Alert and oriented. No focal neurological deficits. Extremities: Symmetric 5 x 5 power. Skin: No rashes, lesions or ulcers Psychiatry: Judgement and insight appear normal. Mood & affect appropriate.   Data Reviewed: I have personally reviewed following labs and imaging studies  CBC: Recent Labs  Lab 06/09/20 1514 06/10/20 0507   WBC 9.0 8.3  NEUTROABS 7.0  --   HGB 10.0* 11.0*  HCT 32.6* 35.6*  MCV 90.3 90.4  PLT 311 369    Basic Metabolic Panel: Recent Labs  Lab 06/09/20 1514 06/10/20 0507  NA 137 136  K 3.7 4.0  CL 103 102  CO2 24 23  GLUCOSE 100* 175*  BUN 17 22*  CREATININE 0.75 0.82  CALCIUM 8.6* 8.6*    GFR: Estimated Creatinine Clearance: 98 mL/min (by C-G formula based on SCr of 0.82 mg/dL).  Liver Function Tests: No results for input(s): AST, ALT, ALKPHOS, BILITOT, PROT, ALBUMIN in the last 168 hours.  CBG: No results for input(s): GLUCAP in the last 168 hours.  Recent Results (from the past 240 hour(s))  Resp Panel by RT-PCR (Flu A&B, Covid) Nasopharyngeal Swab     Status: None   Collection Time: 06/09/20  3:15 PM   Specimen: Nasopharyngeal Swab; Nasopharyngeal(NP) swabs in vial transport medium  Result Value Ref Range Status   SARS Coronavirus 2 by RT PCR NEGATIVE NEGATIVE Final    Comment: (NOTE) SARS-CoV-2 target nucleic acids are NOT DETECTED.  The SARS-CoV-2 RNA is generally detectable in upper respiratory specimens during the acute phase of infection. The lowest concentration of SARS-CoV-2 viral copies this assay can detect is 138 copies/mL. A negative result does not preclude SARS-Cov-2 infection and should not be used as the sole basis for treatment or other patient management decisions. A negative result may occur with  improper specimen collection/handling, submission of specimen other than nasopharyngeal swab, presence of viral mutation(s) within the areas targeted by this assay, and inadequate number of viral copies(<138 copies/mL). A negative result must be combined with clinical observations, patient history, and epidemiological information. The expected result is Negative.  Fact Sheet for Patients:  BloggerCourse.comhttps://www.fda.gov/media/152166/download  Fact Sheet for Healthcare Providers:  SeriousBroker.ithttps://www.fda.gov/media/152162/download  This test is no t yet approved or  cleared by the [place] FDA and  has been authorized for detection and/or diagnosis of SARS-CoV-2 by FDA under an Emergency Use Authorization (EUA). This EUA will remain  in effect (meaning this test can be used) for the duration of the COVID-19 declaration under Section 564(b)(1) of the Act, 21 U.S.C.section 360bbb-3(b)(1), unless the authorization is terminated  or revoked sooner.       Influenza A by PCR NEGATIVE NEGATIVE Final   Influenza B by PCR NEGATIVE NEGATIVE Final    Comment: (NOTE) The Xpert Xpress SARS-CoV-2/FLU/RSV plus assay is intended as an aid in the diagnosis of influenza from Nasopharyngeal swab specimens and should not be used as a sole basis for treatment. Nasal washings and aspirates are unacceptable for Xpert Xpress SARS-CoV-2/FLU/RSV testing.  Fact Sheet for Patients: BloggerCourse.comhttps://www.fda.gov/media/152166/download  Fact Sheet for Healthcare Providers: SeriousBroker.ithttps://www.fda.gov/media/152162/download  This test is not yet approved or cleared by the [place] FDA and has been authorized for detection and/or diagnosis of SARS-CoV-2 by FDA under an Emergency Use  Authorization (EUA). This EUA will remain in effect (meaning this test can be used) for the duration of the COVID-19 declaration under Section 564(b)(1) of the Act, 21 U.S.C. section 360bbb-3(b)(1), unless the authorization is terminated or revoked.  Performed at [facility], [address], [place], [place] [address]   Culture, blood (routine x 2)     Status: None (Preliminary result)   Collection Time: 06/09/20  5:39 PM   Specimen: BLOOD RIGHT HAND  Result Value Ref Range Status   Specimen Description BLOOD RIGHT HAND  Final   Special Requests   Final    BOTTLES DRAWN AEROBIC AND ANAEROBIC Blood Culture results may not be optimal due to an inadequate volume of blood received in culture bottles   Culture   Final    NO GROWTH < 24 HOURS Performed at [facility], [address], [place],  [place] [address]    Report Status PENDING  Incomplete  Culture, blood (routine x 2)     Status: None (Preliminary result)   Collection Time: 06/09/20  5:44 PM   Specimen: Right Antecubital; Blood  Result Value Ref Range Status   Specimen Description RIGHT ANTECUBITAL  Final   Special Requests   Final    BOTTLES DRAWN AEROBIC AND ANAEROBIC Blood Culture results may not be optimal due to an inadequate volume of blood received in culture bottles   Culture   Final    NO GROWTH < 24 HOURS Performed at [facility], [address], [place], [place] [address]    Report Status PENDING  Incomplete  MRSA PCR Screening     Status: None   Collection Time: 06/10/20 12:19 AM   Specimen: Nasal Mucosa; Nasopharyngeal  Result Value Ref Range Status   MRSA by PCR NEGATIVE NEGATIVE Final    Comment:        The GeneXpert MRSA Assay (FDA approved for NASAL specimens only), is one component of a comprehensive MRSA colonization surveillance program. It is not intended to diagnose MRSA infection nor to guide or monitor treatment for MRSA infections. Performed at [facility], [address] [place], [place] [address]      Radiology Studies: DG Chest 1 View  Result Date: 06/10/2020 CLINICAL DATA:  CHF, history of COPD and hypertension EXAM: CHEST  1 VIEW COMPARISON:  Radiograph 06/09/2020 FINDINGS: Increasing opacity is seen in the right upper lobe adjacent the minor fissure with some slight fissural thickening. This finding is on a background of more diffuse hazy interstitial opacity, cuffing, and pulmonary vascular congestion. No pneumothorax or effusion. No acute osseous or soft tissue abnormality. Degenerative changes are present in the imaged spine and shoulders. Telemetry leads overlie the chest. IMPRESSION: 1. Increasing opacity in the right upper lobe adjacent the minor fissure, could reflect developing infection 2. Additional features could suggest a background of CHF/volume overload with vascular  congestion and perhaps mild interstitial edema. Electronically Signed   By: Kreg Shropshire M.D.   On: 06/10/2020 05:33   DG Chest Port 1 View  Result Date: 06/09/2020 CLINICAL DATA:  Chest pain and cough EXAM: PORTABLE CHEST 1 VIEW COMPARISON:  None. FINDINGS: There is ill-defined airspace opacity throughout the lungs bilaterally. No consolidation. Heart is upper normal in size with pulmonary vascularity normal. No adenopathy. No bone lesions. IMPRESSION: Ill-defined patchy airspace opacity bilaterally. Suspect atypical organism pneumonia throughout lungs bilaterally. No consolidation. Heart upper normal in size.  No evident adenopathy. Electronically Signed   By: Bretta Bang III M.D.   On:  06/09/2020 15:31   Scheduled Meds: . aspirin EC  81 mg Oral Daily  . budesonide (PULMICORT) nebulizer solution  0.5 mg Nebulization BID  . Chlorhexidine Gluconate Cloth  6 each Topical Daily  . doxycycline  100 mg Oral Q12H  . fluticasone furoate-vilanterol  1 puff Inhalation Daily  . hydrochlorothiazide  25 mg Oral Daily  . ipratropium  0.5 mg Nebulization BID  . lisinopril  20 mg Oral Daily  . LORazepam  1 mg Oral BID  . nicotine  21 mg Transdermal Daily  . rosuvastatin  20 mg Oral q1800  . venlafaxine  75 mg Oral BID   Continuous Infusions: . heparin 1,500 Units/hr (06/10/20 1111)     LOS: 1 day   Critical Care Procedure Note Authorized and Performed by: Maryln Manuel MD  Total Critical Care time:  40 mins Due to a high probability of clinically significant, life threatening deterioration, the patient required my highest level of preparedness to intervene emergently and I personally spent this critical care time directly and personally managing the patient.  This critical care time included obtaining a history; examining the patient, pulse oximetry; ordering and review of studies; arranging urgent treatment with development of a management plan; evaluation of patient's response of treatment;  frequent reassessment; and discussions with other providers.  This critical care time was performed to assess and manage the high probability of imminent and life threatening deterioration that could result in multi-organ failure.  It was exclusive of separately billable procedures and treating other patients and teaching time.    Standley Dakins, MD How to contact the [facility] Attending or Consulting provider 7A - 7P or covering provider during after hours 7P -7A, for this patient?  1. Check the care team in [facility] and look for a) attending/consulting TRH provider listed and b) the [facility] team listed 2. Log into [email or web address] and use [place]'s universal password to access. If you do not have the password, please contact the hospital operator. 3. Locate the [facility] provider you are looking for under [facility] and page to a number that you can be directly reached. 4. If you still have difficulty reaching the provider, please page the [facility] (Director on Call) for the Hospitalists listed on amion for assistance.  06/10/2020, 12:40 PM

## 2020-06-10 NOTE — Progress Notes (Signed)
*  PRELIMINARY RESULTS* Echocardiogram 2D Echocardiogram has been performed.  Stacey Drain 06/10/2020, 11:14 AM

## 2020-06-10 NOTE — Progress Notes (Signed)
OT Cancellation Note  Patient Details Name: [name] MRN: [id number] DOB: 1970/10/03   Cancelled Treatment:    Reason Eval/Treat Not Completed: OT screened, no needs identified, will sign off. Pt screened for OT needs. Pt performing ADLs and mobility tasks independently. No further OT services required at this time.    Ezra Sites, OTR/L  [phone] 06/10/2020, 11:27 AM

## 2020-06-10 NOTE — Progress Notes (Signed)
ANTICOAGULATION CONSULT NOTE - Follow Up Consult  Pharmacy Consult for heparin Indication: NSTEMI  Labs: Recent Labs    06/09/20 1514 06/09/20 1714 06/10/20 0030 06/10/20 0507 06/10/20 0816  HGB 10.0*  --   --  11.0*  --   HCT 32.6*  --   --  35.6*  --   PLT 311  --   --  369  --   HEPARINUNFRC  --   --  <0.10*  --  0.14*  CREATININE 0.75  --   --  0.82  --   TROPONINIHS 245* 239*  --  102*  --     Assessment:  Pharmacy consulted to dose heparin infusion for this 49 yo female  with NSTEMI.     Baseline CBC indicates lower Hb of 10.0mg /dL.  Patient wasn't taking any anti-coagulant medications prior to admission.    Goal of Therapy:  Heparin level 0.3-0.7 units/ml     06/10/20 1000 update: HL: 0.14IU/mL, still below goal range on heparin at 1300 units/hr CBC:  Hb 11 (was 10)    Plates: 111>552 RN reports no bleeding concerns and no issues with infusion   Plan:  Repeat heparin bolus of 3000 units IV  x1   Increase heparin infusion rate to 1500 units/hr Re-check heparin level at 1400 today  Daily HL and CBC while on heparin Monitor for S/S of bleeding  Tama High, Pharm. D. Clinical Pharmacist 06/10/2020 10:29 AM

## 2020-06-10 NOTE — Progress Notes (Signed)
ANTICOAGULATION CONSULT NOTE - Follow Up Consult  Pharmacy Consult for heparin Indication: NSTEMI  Labs: Recent Labs    06/09/20 1514 06/09/20 1714 06/10/20 0030 06/10/20 0507 06/10/20 0816 06/10/20 1432  HGB 10.0*  --   --  11.0*  --   --   HCT 32.6*  --   --  35.6*  --   --   PLT 311  --   --  369  --   --   HEPARINUNFRC  --   --  <0.10*  --  0.14* 0.52  CREATININE 0.75  --   --  0.82  --   --   TROPONINIHS 245* 239*  --  102*  --   --     Assessment:  Pharmacy consulted to dose heparin infusion for this 49 yo female  with NSTEMI.     Baseline CBC indicates lower Hb of 10.0mg /dL.  Patient wasn't taking any anti-coagulant medications prior to admission.    Goal of Therapy:  Heparin level 0.3-0.7 units/ml     06/10/20 1600 update: HL: 0.52 IU/mL, now within goal range on heparin at 1500 units/hr CBC:  Hb 11 (was 10)    Plates: 762>831 RN reports no bleeding concerns and no issues with infusion   Plan:    Continue heparin infusion rate at  1500 units/hr Check confirmatory heparin level at 2000 today  Daily HL and CBC while on heparin Monitor for S/S of bleeding  Tama High, Pharm. D. Clinical Pharmacist 06/10/2020 4:01 PM

## 2020-06-10 NOTE — Progress Notes (Signed)
    Dr. Diona Browner read the patient's echocardiogram which shows normal pumping function of the heart with a preserved EF of 65 to 70% and no regional wall motion abnormalities. She did have grade 2 diastolic dysfunction. Per discussion with him, can stop Heparin at this time. No indication for inpatient ischemic testing currently. Will arrange for outpatient follow-up within the next 2 weeks and can review obtaining a stress test then.  Signed, Ellsworth Lennox, PA-C 06/10/2020, 4:41 PM Pager: [phone]

## 2020-06-11 MED ORDER — ASPIRIN 81 MG PO TBEC: 81.0000 mg | DELAYED_RELEASE_TABLET | Freq: Every day | ORAL | 11 refills | Status: AC

## 2020-06-11 MED ORDER — ROSUVASTATIN CALCIUM 20 MG PO TABS: 20.0000 mg | ORAL_TABLET | Freq: Every day | ORAL | 1 refills | Status: AC

## 2020-06-11 MED ORDER — FLUTICASONE FUROATE-VILANTEROL 100-25 MCG/INH IN AEPB: 1.0000 | INHALATION_SPRAY | Freq: Every day | RESPIRATORY_TRACT | 1 refills | Status: AC

## 2020-06-11 MED ORDER — ALBUTEROL SULFATE HFA 108 (90 BASE) MCG/ACT IN AERS: 2.0000 | INHALATION_SPRAY | RESPIRATORY_TRACT | 2 refills | Status: AC | PRN

## 2020-06-11 MED ORDER — LISINOPRIL 20 MG PO TABS: 20.0000 mg | ORAL_TABLET | Freq: Every day | ORAL | 1 refills | Status: AC

## 2020-06-11 MED ORDER — HYDROCHLOROTHIAZIDE 25 MG PO TABS: 25.0000 mg | ORAL_TABLET | Freq: Every day | ORAL | 1 refills | Status: AC

## 2020-06-11 MED ORDER — DEXTROMETHORPHAN POLISTIREX ER 30 MG/5ML PO SUER
60.0000 mg | Freq: Two times a day (BID) | ORAL | 0 refills | Status: AC | PRN
Start: 2020-06-11 — End: ?

## 2020-06-11 MED ORDER — HYDROCOD POLST-CPM POLST ER 10-8 MG/5ML PO SUER: 5.0000 mL | Freq: Two times a day (BID) | ORAL | Status: DC | PRN

## 2020-06-11 MED ORDER — DOXYCYCLINE HYCLATE 100 MG PO TABS: 100.0000 mg | ORAL_TABLET | Freq: Two times a day (BID) | ORAL | 0 refills | Status: AC

## 2020-06-11 NOTE — TOC Progression Note (Signed)
Transition of Care [facility]) - Progression Note    Patient Details  Name: [name] MRN: [id number] Date of Birth: 1970/10/11  Transition [facility]) CM/SW Contact  [place] Cellar, RN Phone Number: 06/11/2020, 12:48 PM  Clinical Narrative:     [facility]care completed and patient set up for [facility] letter. $3/copay   Expected Discharge Plan: Home/Self Care Barriers to Discharge: Continued Medical Work up  Expected Discharge Plan and Services Expected Discharge Plan: Home/Self Care In-house Referral: NA Discharge Planning Services: Other - See comment (Medicaid referral)   Living arrangements for the past 2 months: Single Family Home Expected Discharge Date: 06/11/20               DME Arranged: N/A DME Agency: NA       HH Arranged: NA HH Agency: NA         Social Determinants of Health (SDOH) Interventions    Readmission Risk Interventions No flowsheet data found.

## 2020-06-11 NOTE — Discharge Instructions (Signed)
Community-Acquired Pneumonia, Adult Pneumonia is an infection of the lungs. It causes swelling in the airways of the lungs. Mucus and fluid may also build up inside the airways. One type of pneumonia can happen while a person is in a hospital. A different type can happen when a person is not in a hospital (community-acquired pneumonia).  What are the causes?  This condition is caused by germs (viruses, bacteria, or fungi). Some types of germs can be passed from one person to another. This can happen when you breathe in droplets from the cough or sneeze of an infected person. What increases the risk? You are more likely to develop this condition if you:  Have a long-term (chronic) disease, such as: ? Chronic obstructive pulmonary disease (COPD). ? Asthma. ? Cystic fibrosis. ? Congestive heart failure. ? Diabetes. ? Kidney disease.  Have HIV.  Have sickle cell disease.  Have had your spleen removed.  Do not take good care of your teeth and mouth (poor dental hygiene).  Have a medical condition that increases the risk of breathing in droplets from your own mouth and nose.  Have a weakened body defense system (immune system).  Are a smoker.  Travel to areas where the germs that cause this illness are common.  Are around certain animals or the places they live. What are the signs or symptoms?  A dry cough.  A wet (productive) cough.  Fever.  Sweating.  Chest pain. This often happens when breathing deeply or coughing.  Fast breathing or trouble breathing.  Shortness of breath.  Shaking chills.  Feeling tired (fatigue).  Muscle aches. How is this treated? Treatment for this condition depends on many things. Most adults can be treated at home. In some cases, treatment must happen in a hospital. Treatment may include:  Medicines given by mouth or through an IV tube.  Being given extra oxygen.  Respiratory therapy. In rare cases, treatment for very bad pneumonia  may include:  Using a machine to help you breathe.  Having a procedure to remove fluid from around your lungs. Follow these instructions at home: Medicines  Take over-the-counter and prescription medicines only as told by your doctor. ? Only take cough medicine if you are losing sleep.  If you were prescribed an antibiotic medicine, take it as told by your doctor. Do not stop taking the antibiotic even if you start to feel better. General instructions   Sleep with your head and neck raised (elevated). You can do this by sleeping in a recliner or by putting a few pillows under your head.  Rest as needed. Get at least 8 hours of sleep each night.  Drink enough water to keep your pee (urine) pale yellow.  Eat a healthy diet that includes plenty of vegetables, fruits, whole grains, low-fat dairy products, and lean protein.  Do not use any products that contain nicotine or tobacco. These include cigarettes, e-cigarettes, and chewing tobacco. If you need help quitting, ask your doctor.  Keep all follow-up visits as told by your doctor. This is important. How is this prevented? A shot (vaccine) can help prevent pneumonia. Shots are often suggested for:  People older than 49 years of age.  People older than 49 years of age who: ? Are having cancer treatment. ? Have long-term (chronic) lung disease. ? Have problems with their body's defense system. You may also prevent pneumonia if you take these actions:  Get the flu (influenza) shot every year.  Go to the dentist as   often as told.  Wash your hands often. If you cannot use soap and water, use hand sanitizer. Contact a doctor if:  You have a fever.  You lose sleep because your cough medicine does not help. Get help right away if:  You are short of breath and it gets worse.  You have more chest pain.  Your sickness gets worse. This is very serious if: ? You are an older adult. ? Your body's defense system is weak.  You  cough up blood. Summary  Pneumonia is an infection of the lungs.  Most adults can be treated at home. Some will need treatment in a hospital.  Drink enough water to keep your pee pale yellow.  Get at least 8 hours of sleep each night. This information is not intended to replace advice given to you by your health care provider. Make sure you discuss any questions you have with your health care provider. Document Revised: 09/25/2018 Document Reviewed: 01/31/2018 Elsevier Patient Education  2020 [employer]  

## 2020-06-11 NOTE — Progress Notes (Signed)
Pt discharged via WC to POV by NT with all her personal belongings in her possession.

## 2020-06-11 NOTE — Discharge Summary (Signed)
Physician Discharge Summary  [name]:[id number] DOB: May 15, 1971 DOA: 06/09/2020  PCP: Smith Robert, MD  Admit date: 06/09/2020 Discharge date: 06/11/2020  Admitted From:  Home  Disposition: Home   Recommendations for Outpatient Follow-up:  1. Follow up with PCP in 1 weeks 2. Follow up with cardiology as scheduled   Discharge Condition: STABLE   CODE STATUS: FULL    Brief Hospitalization Summary: Please see all hospital notes, images, labs for full details of the hospitalization. 49 y.o.femalewith medical history significant ofHTNnoncompliant with BP meds, COPD noncompliant with recent meds, chronic pain syndrome, anxiety depression, presented with new onset of chest pain and shortness of breath. Patient ran out of her HCTZ 4 days ago and about 3 days ago shestarted to have coughwithscanty yellowish sputum, wheezingand pleuritic chest pain. Denied fever chills. This morning, sharp-like chest pain woke her up 6-7/10,, hard to take deep breaths will trigger significant worsening of chest pains. EMS arrived, she was given aspirin and nitroglycerin with minimal improvement.  ED Course:Blood pressure significantly elevated with systolic in the 170s and diastolic in the upper 100s.Trop 324 1st set, EKG noischemic changes. Chest x-ray showed multifocal interest suspicious for atypical pneumonia versus edema.  Assessment & Plan:   Active Problems:   NSTEMI (non-ST elevated myocardial infarction) (HCC)   Multifocal pneumonia   CHF (congestive heart failure) (HCC)   Hypertension   COPD (chronic obstructive pulmonary disease) (HCC)   Cocaine abuse (HCC)   1. NSTEMI type II - Pt was briefly on IV heparin infusion but now has been discontinued by cardiology, 2D echocardiogram with no findings of regional wall abnormalities.  Outpatient cardiology follow up was recommended. Further recommendations pending outpatient follow up.  2. Cocaine use - Pt  reports at least 5-6 days since last use.  Holding carvedilol for now due to ongoing cocaine use.    3. Essential hypertensive with hypertensive urgency - BP much better controlled, follow and adjust therapy as needed.  4. COPD - continue bronchodilators. No acute exacerbation.  5. Heart Failure preserved EF - mild edema on CXR - 2D echocardiogram below.  6. CAP - Pt is being treated with doxycycline and supportive measures.    DVT prophylaxis: IV heparin infusion  Code Status: full  Family Communication:  Disposition: home when medically cleared  Discharge Diagnoses:  Active Problems:   NSTEMI (non-ST elevated myocardial infarction) (HCC)   Multifocal pneumonia   CHF (congestive heart failure) (HCC)   Hypertension   COPD (chronic obstructive pulmonary disease) (HCC)   Cocaine abuse (HCC)   Discharge Instructions:  Allergies as of 06/11/2020      Reactions   Coconut Oil Shortness Of Breath   Latex Swelling   Peach [prunus Persica] Hives   Toradol [ketorolac Tromethamine] Itching   Tramadol Swelling      Medication List    STOP taking these medications   diclofenac 75 MG EC tablet Commonly known as: VOLTAREN   ferrous sulfate 325 (65 FE) MG tablet   HYDROcodone-acetaminophen 5-325 MG tablet Commonly known as: NORCO/VICODIN     TAKE these medications   albuterol 108 (90 Base) MCG/ACT inhaler Commonly known as: VENTOLIN HFA Inhale 2 puffs into the lungs every 4 (four) hours as needed for wheezing or shortness of breath (cough, shortness of breath or wheezing.).   aspirin 81 MG EC tablet Take 1 tablet (81 mg total) by mouth daily. Swallow whole. Start taking on: June 12, 2020   Aspirin-Salicylamide-Caffeine 650-195-33.3 MG Pack Take 1 packet  by mouth daily as needed (for pain).   dextromethorphan 30 MG/5ML liquid Commonly known as: Delsym Take 10 mLs (60 mg total) by mouth 2 (two) times daily as needed for cough.   doxycycline 100 MG tablet Commonly known  as: VIBRA-TABS Take 1 tablet (100 mg total) by mouth every 12 (twelve) hours for 5 days.   fluticasone furoate-vilanterol 100-25 MCG/INH Aepb Commonly known as: BREO ELLIPTA Inhale 1 puff into the lungs daily. Start taking on: June 12, 2020   hydrochlorothiazide 25 MG tablet Commonly known as: HYDRODIURIL Take 1 tablet (25 mg total) by mouth daily.   lisinopril 20 MG tablet Commonly known as: ZESTRIL Take 1 tablet (20 mg total) by mouth daily. Start taking on: June 12, 2020   oxyCODONE-acetaminophen 10-325 MG tablet Commonly known as: PERCOCET Take 1 tablet by mouth 4 (four) times daily as needed.   rosuvastatin 20 MG tablet Commonly known as: CRESTOR Take 1 tablet (20 mg total) by mouth daily at 6 PM.   venlafaxine 75 MG tablet Commonly known as: EFFEXOR Take 75 mg by mouth 2 (two) times daily.       Follow-up Information    Netta NeatQuinn, Andrew L Jr., NP Follow up on 06/25/2020.   Specialty: Cardiology Why: [facility]iology [facility] Follow-up on 06/25/2020 at 2:30 PM.  Contact information: [address] [place] [place] [address] [phone]        Smith RobertKikel, Stephen, MD. Schedule an appointment as soon as possible for a visit in 1 week(s).   Specialty: Family Medicine Contact information: [address] [place] [address] [phone]              Allergies  Allergen Reactions  . Coconut Oil Shortness Of Breath  . Latex Swelling  . Peach [Prunus Persica] Hives  . Toradol [Ketorolac Tromethamine] Itching  . Tramadol Swelling   Allergies as of 06/11/2020      Reactions   Coconut Oil Shortness Of Breath   Latex Swelling   Peach [prunus Persica] Hives   Toradol [ketorolac Tromethamine] Itching   Tramadol Swelling      Medication List    STOP taking these medications   diclofenac 75 MG EC tablet Commonly known as: VOLTAREN   ferrous sulfate 325 (65 FE) MG tablet   HYDROcodone-acetaminophen 5-325 MG tablet Commonly known as: NORCO/VICODIN      TAKE these medications   albuterol 108 (90 Base) MCG/ACT inhaler Commonly known as: VENTOLIN HFA Inhale 2 puffs into the lungs every 4 (four) hours as needed for wheezing or shortness of breath (cough, shortness of breath or wheezing.).   aspirin 81 MG EC tablet Take 1 tablet (81 mg total) by mouth daily. Swallow whole. Start taking on: June 12, 2020   Aspirin-Salicylamide-Caffeine 578-469-62.9650-195-33.3 MG Pack Take 1 packet by mouth daily as needed (for pain).   dextromethorphan 30 MG/5ML liquid Commonly known as: Delsym Take 10 mLs (60 mg total) by mouth 2 (two) times daily as needed for cough.   doxycycline 100 MG tablet Commonly known as: VIBRA-TABS Take 1 tablet (100 mg total) by mouth every 12 (twelve) hours for 5 days.   fluticasone furoate-vilanterol 100-25 MCG/INH Aepb Commonly known as: BREO ELLIPTA Inhale 1 puff into the lungs daily. Start taking on: June 12, 2020   hydrochlorothiazide 25 MG tablet Commonly known as: HYDRODIURIL Take 1 tablet (25 mg total) by mouth daily.   lisinopril 20 MG tablet Commonly known as: ZESTRIL Take 1 tablet (20 mg total) by mouth  daily. Start taking on: June 12, 2020   oxyCODONE-acetaminophen 10-325 MG tablet Commonly known as: PERCOCET Take 1 tablet by mouth 4 (four) times daily as needed.   rosuvastatin 20 MG tablet Commonly known as: CRESTOR Take 1 tablet (20 mg total) by mouth daily at 6 PM.   venlafaxine 75 MG tablet Commonly known as: EFFEXOR Take 75 mg by mouth 2 (two) times daily.       Procedures/Studies: DG Chest 1 View  Result Date: 06/10/2020 CLINICAL DATA:  CHF, history of COPD and hypertension EXAM: CHEST  1 VIEW COMPARISON:  Radiograph 06/09/2020 FINDINGS: Increasing opacity is seen in the right upper lobe adjacent the minor fissure with some slight fissural thickening. This finding is on a background of more diffuse hazy interstitial opacity, cuffing, and pulmonary vascular congestion. No  pneumothorax or effusion. No acute osseous or soft tissue abnormality. Degenerative changes are present in the imaged spine and shoulders. Telemetry leads overlie the chest. IMPRESSION: 1. Increasing opacity in the right upper lobe adjacent the minor fissure, could reflect developing infection 2. Additional features could suggest a background of CHF/volume overload with vascular congestion and perhaps mild interstitial edema. Electronically Signed   By: Kreg Shropshire M.D.   On: 06/10/2020 05:33   DG Chest Port 1 View  Result Date: 06/09/2020 CLINICAL DATA:  Chest pain and cough EXAM: PORTABLE CHEST 1 VIEW COMPARISON:  None. FINDINGS: There is ill-defined airspace opacity throughout the lungs bilaterally. No consolidation. Heart is upper normal in size with pulmonary vascularity normal. No adenopathy. No bone lesions. IMPRESSION: Ill-defined patchy airspace opacity bilaterally. Suspect atypical organism pneumonia throughout lungs bilaterally. No consolidation. Heart upper normal in size.  No evident adenopathy. Electronically Signed   By: Bretta Bang III M.D.   On: 06/09/2020 15:31   ECHOCARDIOGRAM COMPLETE  Result Date: 06/10/2020    ECHOCARDIOGRAM REPORT   Patient Name:   NAKIESHA RUMSEY Date of Exam: 06/10/2020 Medical Rec #:  [id number]       Height:       63.5 in Accession #:    [id number]      Weight:       235.0 lb Date of Birth:  May 16, 1971       BSA:          2.083 m Patient Age:    49 years        BP:           158/76 mmHg Patient Gender: F               HR:           83 bpm. [facility] Procedure: 2D Echo, Cardiac Doppler and Color Doppler Indications:    NSTEMI I21.4  History:        Patient has no prior history of Echocardiogram examinations.                 COPD; Risk Factors:Hypertension.  Sonographer:    Celesta Gentile RCS Referring Phys: [id number] [place] General IMPRESSIONS  1. Left ventricular ejection fraction, by estimation, is 65 to 70%. The left ventricle has normal  function. The left ventricle has no regional wall motion abnormalities. There is mild left ventricular hypertrophy. Left ventricular diastolic parameters are consistent with Grade II diastolic dysfunction (pseudonormalization).  2. Right ventricular systolic function is normal. The right ventricular size is normal. Tricuspid regurgitation signal is inadequate for assessing PA pressure.  3. Left atrial size was mildly dilated.  4. There is a trivial pericardial effusion posterior to the left ventricle.  5. The mitral valve is grossly normal. Trivial mitral valve regurgitation.  6. The aortic valve is tricuspid. Aortic valve regurgitation is not visualized.  7. The inferior vena cava is normal in size with greater than 50% respiratory variability, suggesting right atrial pressure of 3 mmHg. FINDINGS  Left Ventricle: Left ventricular ejection fraction, by estimation, is 65 to 70%. The left ventricle has normal function. The left ventricle has no regional wall motion abnormalities. The left ventricular internal cavity size was normal in size. There is  mild left ventricular hypertrophy. Left ventricular diastolic parameters are consistent with Grade II diastolic dysfunction (pseudonormalization). Right Ventricle: The right ventricular size is normal. No increase in right ventricular wall thickness. Right ventricular systolic function is normal. Tricuspid regurgitation signal is inadequate for assessing PA pressure. Left Atrium: Left atrial size was mildly dilated. Right Atrium: Right atrial size was normal in size. Pericardium: Trivial pericardial effusion is present. The pericardial effusion is posterior to the left ventricle. Mitral Valve: The mitral valve is grossly normal. Mild mitral annular calcification. Trivial mitral valve regurgitation. Tricuspid Valve: The tricuspid valve is grossly normal. Tricuspid valve regurgitation is mild. Aortic Valve: The aortic valve is tricuspid. There is mild aortic valve annular  calcification. Aortic valve regurgitation is not visualized. Pulmonic Valve: The pulmonic valve was grossly normal. Pulmonic valve regurgitation is trivial. Aorta: The aortic root is normal in size and structure. Venous: The inferior vena cava is normal in size with greater than 50% respiratory variability, suggesting right atrial pressure of 3 mmHg. IAS/Shunts: No atrial level shunt detected by color flow Doppler.  LEFT VENTRICLE PLAX 2D LVIDd:         4.60 cm  Diastology LVIDs:         2.40 cm  LV e' medial:    6.42 cm/s LV PW:         1.10 cm  LV E/e' medial:  22.1 LV IVS:        1.20 cm  LV e' lateral:   7.72 cm/s LVOT diam:     1.70 cm  LV E/e' lateral: 18.4 LV SV:         66 LV SV Index:   31 LVOT Area:     2.27 cm  RIGHT VENTRICLE RV S prime:     14.40 cm/s TAPSE (M-mode): 2.7 cm LEFT ATRIUM             Index       RIGHT ATRIUM           Index LA diam:        4.30 cm 2.06 cm/m  RA Area:     12.90 cm LA Vol (A2C):   94.6 ml 45.42 ml/m RA Volume:   26.60 ml  12.77 ml/m LA Vol (A4C):   67.3 ml 32.31 ml/m LA Biplane Vol: 80.0 ml 38.41 ml/m  AORTIC VALVE LVOT Vmax:   144.00 cm/s LVOT Vmean:  90.800 cm/s LVOT VTI:    0.289 m  AORTA Ao Root diam: 2.90 cm MITRAL VALVE MV Area (PHT): 3.20 cm     SHUNTS MV Decel Time: 237 msec     Systemic VTI:  0.29 m MV E velocity: 142.00 cm/s  Systemic Diam: 1.70 cm MV A velocity: 74.90 cm/s MV E/A ratio:  1.90 Nona Dell MD Electronically signed by Nona Dell MD Signature Date/Time: 06/10/2020/3:53:44 PM    Final  Subjective: Pt say she is feeling much better. Her breathing is better today.   Discharge Exam: Vitals:   06/11/20 0754 06/11/20 0956  BP:  (!) 174/92  Pulse:  84  Resp:  18  Temp:  97.7 F (36.5 C)  SpO2: 98% 97%   Vitals:   06/10/20 1938 06/10/20 2016 06/11/20 0754 06/11/20 0956  BP:  (!) 162/85  (!) 174/92  Pulse:  79  84  Resp:  18  18  Temp:  (!) 97.5 F (36.4 C)  97.7 F (36.5 C)  TempSrc:    Oral  SpO2: 94% 98% 98% 97%   Weight:      Height:       General: Pt is alert, awake, not in acute distress Cardiovascular: RRR, S1/S2 +, no rubs, no gallops Respiratory: CTA bilaterally, no wheezing, no rhonchi Abdominal: Soft, NT, ND, bowel sounds + Extremities: no edema, no cyanosis   The results of significant diagnostics from this hospitalization (including imaging, microbiology, ancillary and laboratory) are listed below for reference.     Microbiology: Recent Results (from the past 240 hour(s))  Resp Panel by RT-PCR (Flu A&B, Covid) Nasopharyngeal Swab     Status: None   Collection Time: 06/09/20  3:15 PM   Specimen: Nasopharyngeal Swab; Nasopharyngeal(NP) swabs in vial transport medium  Result Value Ref Range Status   SARS Coronavirus 2 by RT PCR NEGATIVE NEGATIVE Final    Comment: (NOTE) SARS-CoV-2 target nucleic acids are NOT DETECTED.  The SARS-CoV-2 RNA is generally detectable in upper respiratory specimens during the acute phase of infection. The lowest concentration of SARS-CoV-2 viral copies this assay can detect is 138 copies/mL. A negative result does not preclude SARS-Cov-2 infection and should not be used as the sole basis for treatment or other patient management decisions. A negative result may occur with  improper specimen collection/handling, submission of specimen other than nasopharyngeal swab, presence of viral mutation(s) within the areas targeted by this assay, and inadequate number of viral copies(<138 copies/mL). A negative result must be combined with clinical observations, patient history, and epidemiological information. The expected result is Negative.  Fact Sheet for Patients:  BloggerCourse.com  Fact Sheet for Healthcare Providers:  SeriousBroker.it  This test is no t yet approved or cleared by the [place] FDA and  has been authorized for detection and/or diagnosis of SARS-CoV-2 by FDA under an Emergency Use  Authorization (EUA). This EUA will remain  in effect (meaning this test can be used) for the duration of the COVID-19 declaration under Section 564(b)(1) of the Act, 21 U.S.C.section 360bbb-3(b)(1), unless the authorization is terminated  or revoked sooner.       Influenza A by PCR NEGATIVE NEGATIVE Final   Influenza B by PCR NEGATIVE NEGATIVE Final    Comment: (NOTE) The Xpert Xpress SARS-CoV-2/FLU/RSV plus assay is intended as an aid in the diagnosis of influenza from Nasopharyngeal swab specimens and should not be used as a sole basis for treatment. Nasal washings and aspirates are unacceptable for Xpert Xpress SARS-CoV-2/FLU/RSV testing.  Fact Sheet for Patients: BloggerCourse.com  Fact Sheet for Healthcare Providers: SeriousBroker.it  This test is not yet approved or cleared by the [place] FDA and has been authorized for detection and/or diagnosis of SARS-CoV-2 by FDA under an Emergency Use Authorization (EUA). This EUA will remain in effect (meaning this test can be used) for the duration of the COVID-19 declaration under Section 564(b)(1) of the Act, 21 U.S.C. section 360bbb-3(b)(1), unless the authorization is terminated or  revoked.  Performed at [facility], [address] [place], [place] [address]   Culture, blood (routine x 2)     Status: None (Preliminary result)   Collection Time: 06/09/20  5:39 PM   Specimen: BLOOD RIGHT HAND  Result Value Ref Range Status   Specimen Description BLOOD RIGHT HAND  Final   Special Requests   Final    BOTTLES DRAWN AEROBIC AND ANAEROBIC Blood Culture results may not be optimal due to an inadequate volume of blood received in culture bottles   Culture   Final    NO GROWTH 2 DAYS Performed at [facility], [address] [place], [place] [address]    Report Status PENDING  Incomplete  Culture, blood (routine x 2)     Status: None (Preliminary result)   Collection Time:  06/09/20  5:44 PM   Specimen: Right Antecubital; Blood  Result Value Ref Range Status   Specimen Description RIGHT ANTECUBITAL  Final   Special Requests   Final    BOTTLES DRAWN AEROBIC AND ANAEROBIC Blood Culture results may not be optimal due to an inadequate volume of blood received in culture bottles   Culture   Final    NO GROWTH 2 DAYS Performed at [facility], [address] [place], [place] [address]    Report Status PENDING  Incomplete  MRSA PCR Screening     Status: None   Collection Time: 06/10/20 12:19 AM   Specimen: Nasal Mucosa; Nasopharyngeal  Result Value Ref Range Status   MRSA by PCR NEGATIVE NEGATIVE Final    Comment:        The GeneXpert MRSA Assay (FDA approved for NASAL specimens only), is one component of a comprehensive MRSA colonization surveillance program. It is not intended to diagnose MRSA infection nor to guide or monitor treatment for MRSA infections. Performed at [facility], [address], [place], [place] [address]      Labs: BNP (last 3 results) No results for input(s): BNP in the last 8760 hours. Basic Metabolic Panel: Recent Labs  Lab 06/09/20 1514 06/10/20 0507  NA 137 136  K 3.7 4.0  CL 103 102  CO2 24 23  GLUCOSE 100* 175*  BUN 17 22*  CREATININE 0.75 0.82  CALCIUM 8.6* 8.6*   Liver Function Tests: No results for input(s): AST, ALT, ALKPHOS, BILITOT, PROT, ALBUMIN in the last 168 hours. No results for input(s): LIPASE, AMYLASE in the last 168 hours. No results for input(s): AMMONIA in the last 168 hours. CBC: Recent Labs  Lab 06/09/20 1514 06/10/20 0507  WBC 9.0 8.3  NEUTROABS 7.0  --   HGB 10.0* 11.0*  HCT 32.6* 35.6*  MCV 90.3 90.4  PLT 311 369   Cardiac Enzymes: No results for input(s): CKTOTAL, CKMB, CKMBINDEX, TROPONINI in the last 168 hours. BNP: Invalid input(s): POCBNP CBG: No results for input(s): GLUCAP in the last 168 hours. D-Dimer No results for input(s): DDIMER in the last 72 hours. Hgb  A1c Recent Labs    06/09/20 2007  HGBA1C 5.2   Lipid Profile Recent Labs    06/10/20 0507  CHOL 155  HDL 33*  LDLCALC 98  TRIG 742  CHOLHDL 4.7   Thyroid function studies No results for input(s): TSH, T4TOTAL, T3FREE, THYROIDAB in the last 72 hours.  Invalid input(s): FREET3 Anemia work up No results for input(s): VITAMINB12, FOLATE, FERRITIN, TIBC, IRON, RETICCTPCT in the last 72 hours. Urinalysis    Component Value Date/Time   COLORURINE YELLOW 02/02/2014 1416  APPEARANCEUR CLEAR 02/02/2014 1416   LABSPEC >1.030 (H) 02/02/2014 1416   PHURINE 5.5 02/02/2014 1416   GLUCOSEU NEGATIVE 02/02/2014 1416   HGBUR LARGE (A) 02/02/2014 1416   BILIRUBINUR NEGATIVE 02/02/2014 1416   KETONESUR NEGATIVE 02/02/2014 1416   PROTEINUR NEGATIVE 02/02/2014 1416   UROBILINOGEN 0.2 02/02/2014 1416   NITRITE NEGATIVE 02/02/2014 1416   LEUKOCYTESUR NEGATIVE 02/02/2014 1416   Sepsis Labs Invalid input(s): PROCALCITONIN,  WBC,  LACTICIDVEN Microbiology Recent Results (from the past 240 hour(s))  Resp Panel by RT-PCR (Flu A&B, Covid) Nasopharyngeal Swab     Status: None   Collection Time: 06/09/20  3:15 PM   Specimen: Nasopharyngeal Swab; Nasopharyngeal(NP) swabs in vial transport medium  Result Value Ref Range Status   SARS Coronavirus 2 by RT PCR NEGATIVE NEGATIVE Final    Comment: (NOTE) SARS-CoV-2 target nucleic acids are NOT DETECTED.  The SARS-CoV-2 RNA is generally detectable in upper respiratory specimens during the acute phase of infection. The lowest concentration of SARS-CoV-2 viral copies this assay can detect is 138 copies/mL. A negative result does not preclude SARS-Cov-2 infection and should not be used as the sole basis for treatment or other patient management decisions. A negative result may occur with  improper specimen collection/handling, submission of specimen other than nasopharyngeal swab, presence of viral mutation(s) within the areas targeted by this assay,  and inadequate number of viral copies(<138 copies/mL). A negative result must be combined with clinical observations, patient history, and epidemiological information. The expected result is Negative.  Fact Sheet for Patients:  BloggerCourse.com  Fact Sheet for Healthcare Providers:  SeriousBroker.it  This test is no t yet approved or cleared by the [place] FDA and  has been authorized for detection and/or diagnosis of SARS-CoV-2 by FDA under an Emergency Use Authorization (EUA). This EUA will remain  in effect (meaning this test can be used) for the duration of the COVID-19 declaration under Section 564(b)(1) of the Act, 21 U.S.C.section 360bbb-3(b)(1), unless the authorization is terminated  or revoked sooner.       Influenza A by PCR NEGATIVE NEGATIVE Final   Influenza B by PCR NEGATIVE NEGATIVE Final    Comment: (NOTE) The Xpert Xpress SARS-CoV-2/FLU/RSV plus assay is intended as an aid in the diagnosis of influenza from Nasopharyngeal swab specimens and should not be used as a sole basis for treatment. Nasal washings and aspirates are unacceptable for Xpert Xpress SARS-CoV-2/FLU/RSV testing.  Fact Sheet for Patients: BloggerCourse.com  Fact Sheet for Healthcare Providers: SeriousBroker.it  This test is not yet approved or cleared by the [place] FDA and has been authorized for detection and/or diagnosis of SARS-CoV-2 by FDA under an Emergency Use Authorization (EUA). This EUA will remain in effect (meaning this test can be used) for the duration of the COVID-19 declaration under Section 564(b)(1) of the Act, 21 U.S.C. section 360bbb-3(b)(1), unless the authorization is terminated or revoked.  Performed at [facility], [address], [place], [place] [address]   Culture, blood (routine x 2)     Status: None (Preliminary result)   Collection Time: 06/09/20   5:39 PM   Specimen: BLOOD RIGHT HAND  Result Value Ref Range Status   Specimen Description BLOOD RIGHT HAND  Final   Special Requests   Final    BOTTLES DRAWN AEROBIC AND ANAEROBIC Blood Culture results may not be optimal due to an inadequate volume of blood received in culture bottles   Culture   Final    NO GROWTH 2 DAYS Performed  at [facility], [address] [place], [place] [address]    Report Status PENDING  Incomplete  Culture, blood (routine x 2)     Status: None (Preliminary result)   Collection Time: 06/09/20  5:44 PM   Specimen: Right Antecubital; Blood  Result Value Ref Range Status   Specimen Description RIGHT ANTECUBITAL  Final   Special Requests   Final    BOTTLES DRAWN AEROBIC AND ANAEROBIC Blood Culture results may not be optimal due to an inadequate volume of blood received in culture bottles   Culture   Final    NO GROWTH 2 DAYS Performed at [facility], [address] [place], [place] [address]    Report Status PENDING  Incomplete  MRSA PCR Screening     Status: None   Collection Time: 06/10/20 12:19 AM   Specimen: Nasal Mucosa; Nasopharyngeal  Result Value Ref Range Status   MRSA by PCR NEGATIVE NEGATIVE Final    Comment:        The GeneXpert MRSA Assay (FDA approved for NASAL specimens only), is one component of a comprehensive MRSA colonization surveillance program. It is not intended to diagnose MRSA infection nor to guide or monitor treatment for MRSA infections. Performed at [facility], [address], [place], [place] [address]     Time coordinating discharge: 35 minutes   SIGNED:  Standley Dakins, MD  [facility] 06/11/2020, 4:11 PM How to contact the [facility] Attending or Consulting provider 7A - 7P or covering provider during after hours 7P -7A, for this patient?  1. Check the care team in [facility] and look for a) attending/consulting TRH provider listed and b) the [facility] team listed 2. Log into [email or web address] and use [facility]  universal password to access. If you do not have the password, please contact the hospital operator. 3. Locate the [facility] provider you are looking for under [facility] and page to a number that you can be directly reached. 4. If you still have difficulty reaching the provider, please page the [facility] (Director on Call) for the Hospitalists listed on amion for assistance.

## 2020-06-11 NOTE — TOC Transition Note (Signed)
Transition of Care [facility]) - CM/SW Discharge Note   Patient Details  Name: TAMMARA MASSING MRN: [id number] Date of Birth: May 16, 1971  Transition of Care [facility]) CM/SW Contact:  Villa Herb, LCSWA Phone Number: 06/11/2020, 1:24 PM   Clinical Narrative:    CSW spoke with pt about CSW ability to set pt up with [employer] voucher and [employer] for medication assistance. Pt agreeable. RNCM Dagoberto Reef assisted CSW with completing MATCH voucher. MATCH voucher is being provided to pt in room. CSW made referral to [facility]. TOC signing off.    Final next level of care: Home/Self Care Barriers to Discharge: Barriers Resolved,Inadequate or no insurance   Patient Goals and CMS Choice Patient states their goals for this hospitalization and ongoing recovery are:: Return home CMS Medicare.gov Compare Post Acute Care list provided to:: Patient Choice offered to / list presented to : Patient  Discharge Placement                       Discharge Plan and Services In-house Referral: NA Discharge Planning Services: Other - See comment (Medicaid referral)            DME Arranged: N/A DME Agency: NA       HH Arranged: NA HH Agency: NA        Social Determinants of Health (SDOH) Interventions     Readmission Risk Interventions No flowsheet data found.

## 2020-06-14 LAB — CULTURE, BLOOD (ROUTINE X 2)
Culture: NO GROWTH
Culture: NO GROWTH

## 2020-06-24 NOTE — Progress Notes (Deleted)
Cardiology Office Note  Date: 06/24/2020   ID: [name], DOB 11/01/1970, MRN [id number]  PCP:  Smith Robert, MD  Cardiologist:  Nona Dell, MD Electrophysiologist:  None   Chief Complaint: Hospital follow up . Discuss Stress Myoview  History of Present Illness: [name] is a 50 y.o. female with a history of HTN with noncompliance with BP medications, COPD non compliant with medication, Chronic pain syndrome, anxiety, depression.  Recent presentation with new onset chest pain and shortness of breath. She ran out of her HCTZ. Recent onset of cough productive of yellow sputum, wheezing, pleuritic chest pain. Sudden onset in a.m. prior to presentation of sharp chest pain. Stated it was hard to take deep breaths due to significant chest pain. Received NTG and ASA by EMS with little relief. BP was significantly elevated. Troponins elevated at 245>239>102. No ischemic changes on EKG. CXR suspicious for atypical pneumonia vs edema. Admitting diagnoses : NSTEMI, multifocal pneumonia, CHF, HTN, COPD, Cocaine abuse. Echocardiogram with no WMA's with preserved EF. Recent cocaine use approximately 5-6 days prior. BP improved after treatment. COPD continuing inhalers. Continuing doxycycline at discharge.   Past Medical History:  Diagnosis Date  . Anxiety   . COPD (chronic obstructive pulmonary disease) (HCC)   . Depression   . Hypertension     Past Surgical History:  Procedure Laterality Date  . BREAST BIOPSY Left 2015   benign  . CESAREAN SECTION    . TUBAL LIGATION      Current Outpatient Medications  Medication Sig Dispense Refill  . albuterol (VENTOLIN HFA) 108 (90 Base) MCG/ACT inhaler Inhale 2 puffs into the lungs every 4 (four) hours as needed for wheezing or shortness of breath (cough, shortness of breath or wheezing.). 1 each 2  . aspirin EC 81 MG EC tablet Take 1 tablet (81 mg total) by mouth daily. Swallow whole. 30 tablet 11  . Aspirin-Salicylamide-Caffeine  650-195-33.3 MG PACK Take 1 packet by mouth daily as needed (for pain).     Marland Kitchen dextromethorphan (DELSYM) 30 MG/5ML liquid Take 10 mLs (60 mg total) by mouth 2 (two) times daily as needed for cough. 89 mL 0  . fluticasone furoate-vilanterol (BREO ELLIPTA) 100-25 MCG/INH AEPB Inhale 1 puff into the lungs daily. 30 each 1  . hydrochlorothiazide (HYDRODIURIL) 25 MG tablet Take 1 tablet (25 mg total) by mouth daily. 30 tablet 1  . lisinopril (ZESTRIL) 20 MG tablet Take 1 tablet (20 mg total) by mouth daily. 30 tablet 1  . oxyCODONE-acetaminophen (PERCOCET) 10-325 MG tablet Take 1 tablet by mouth 4 (four) times daily as needed.    . rosuvastatin (CRESTOR) 20 MG tablet Take 1 tablet (20 mg total) by mouth daily at 6 PM. 30 tablet 1  . venlafaxine (EFFEXOR) 75 MG tablet Take 75 mg by mouth 2 (two) times daily.     No current facility-administered medications for this visit.   Allergies:  Coconut oil, Latex, Peach [prunus persica], Toradol [ketorolac tromethamine], and Tramadol   Social History: The patient  reports that she has been smoking cigarettes. She has been smoking about 0.50 packs per day. She has never used smokeless tobacco. She reports that she does not drink alcohol and does not use drugs.   Family History: The patient's family history includes Breast cancer in her paternal grandmother.   ROS:  Please see the history of present illness. Otherwise, complete review of systems is positive for {NONE DEFAULTED:18576::"none"}.  All other systems are reviewed and negative.  Physical Exam: VS:  There were no vitals taken for this visit., BMI There is no height or weight on file to calculate BMI.  Wt Readings from Last 3 Encounters:  06/09/20 235 lb (106.6 kg)  06/15/17 217 lb (98.4 kg)  01/01/17 225 lb (102.1 kg)    General: Patient appears comfortable at rest. HEENT: Conjunctiva and lids normal, oropharynx clear with moist mucosa. Neck: Supple, no elevated JVP or carotid bruits, no  thyromegaly. Lungs: Clear to auscultation, nonlabored breathing at rest. Cardiac: Regular rate and rhythm, no S3 or significant systolic murmur, no pericardial rub. Abdomen: Soft, nontender, no hepatomegaly, bowel sounds present, no guarding or rebound. Extremities: No pitting edema, distal pulses 2+. Skin: Warm and dry. Musculoskeletal: No kyphosis. Neuropsychiatric: Alert and oriented x3, affect grossly appropriate.  ECG:  {EKG/Telemetry Strips Reviewed:[id number]}  Recent Labwork: 06/10/2020: BUN 22; Creatinine, Ser 0.82; Hemoglobin 11.0; Platelets 369; Potassium 4.0; Sodium 136     Component Value Date/Time   CHOL 155 06/10/2020 0507   TRIG 118 06/10/2020 0507   HDL 33 (L) 06/10/2020 0507   CHOLHDL 4.7 06/10/2020 0507   VLDL 24 06/10/2020 0507   LDLCALC 98 06/10/2020 0507    Other Studies Reviewed Today:  Echocardiogram 06/10/2020  1. Left ventricular ejection fraction, by estimation, is 65 to 70%. The left ventricle has normal function. The left ventricle has no regional wall motion abnormalities. There is mild left ventricular hypertrophy. Left ventricular diastolic parameters are consistent with Grade II diastolic dysfunction (pseudonormalization). 2. Right ventricular systolic function is normal. The right ventricular size is normal. Tricuspid regurgitation signal is inadequate for assessing PA pressure. 3. Left atrial size was mildly dilated. 4. There is a trivial pericardial effusion posterior to the left ventricle. 5. The mitral valve is grossly normal. Trivial mitral valve regurgitation. 6. The aortic valve is tricuspid. Aortic valve regurgitation is not visualized. 7. The inferior vena cava is normal in size with greater than 50% respiratory variability, suggesting right atrial pressure of 3 mmHg.  Assessment and Plan:  1. NSTEMI (non-ST elevated myocardial infarction) (HCC)   2. Hypertension, unspecified type   3. Cocaine abuse (HCC)   4. Chronic diastolic  heart failure (HCC)    1. NSTEMI (non-ST elevated myocardial infarction) (HCC) ***  2. Hypertension, unspecified type ***  3. Cocaine abuse (HCC) ***  4. Chronic diastolic heart failure (HCC) ***  Medication Adjustments/Labs and Tests Ordered: Current medicines are reviewed at length with the patient today.  Concerns regarding medicines are outlined above.   Disposition: Follow-up with ***  Signed, Rennis Harding, NP 06/24/2020 8:12 PM    [facility] at [facility] [address] [place], [place], [place] [address] Phone: [phone]; Fax: [phone]

## 2020-06-25 ENCOUNTER — Ambulatory Visit: Payer: Self-pay | Admitting: Family Medicine

## 2020-06-25 DIAGNOSIS — F1410 Cocaine abuse, uncomplicated: Secondary | ICD-10-CM

## 2020-06-25 DIAGNOSIS — I5032 Chronic diastolic (congestive) heart failure: Secondary | ICD-10-CM

## 2020-06-25 DIAGNOSIS — I10 Essential (primary) hypertension: Secondary | ICD-10-CM

## 2020-06-25 DIAGNOSIS — I214 Non-ST elevation (NSTEMI) myocardial infarction: Secondary | ICD-10-CM

## 2021-08-17 DEATH — deceased
